# Patient Record
Sex: Female | Born: 1970 | Race: White | Hispanic: No | Smoking: Never smoker
Health system: Southern US, Community
[De-identification: ages and names within clinical notes are randomized; demographics above are authoritative.]

## PROBLEM LIST (undated history)

## (undated) DIAGNOSIS — J45909 Unspecified asthma, uncomplicated: Secondary | ICD-10-CM

## (undated) DIAGNOSIS — K219 Gastro-esophageal reflux disease without esophagitis: Secondary | ICD-10-CM

## (undated) DIAGNOSIS — F419 Anxiety disorder, unspecified: Secondary | ICD-10-CM

## (undated) DIAGNOSIS — K589 Irritable bowel syndrome without diarrhea: Secondary | ICD-10-CM

## (undated) DIAGNOSIS — Z9109 Other allergy status, other than to drugs and biological substances: Secondary | ICD-10-CM

## (undated) DIAGNOSIS — I10 Essential (primary) hypertension: Secondary | ICD-10-CM

## (undated) HISTORY — DX: Unspecified asthma, uncomplicated: J45.909

## (undated) HISTORY — PX: BREAST EXCISIONAL BIOPSY: SUR124

## (undated) HISTORY — DX: Other allergy status, other than to drugs and biological substances: Z91.09

---
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
## 2001-04-02 ENCOUNTER — Other Ambulatory Visit: Admission: RE | Admit: 2001-04-02 | Discharge: 2001-04-02 | Payer: Self-pay | Admitting: *Deleted

## 2002-08-30 ENCOUNTER — Other Ambulatory Visit: Admission: RE | Admit: 2002-08-30 | Discharge: 2002-08-30 | Payer: Self-pay | Admitting: *Deleted

## 2003-02-24 ENCOUNTER — Emergency Department (HOSPITAL_COMMUNITY): Admission: EM | Admit: 2003-02-24 | Discharge: 2003-02-24 | Payer: Self-pay | Admitting: Emergency Medicine

## 2003-10-31 ENCOUNTER — Other Ambulatory Visit: Admission: RE | Admit: 2003-10-31 | Discharge: 2003-10-31 | Payer: Self-pay | Admitting: *Deleted

## 2004-11-17 ENCOUNTER — Other Ambulatory Visit: Admission: RE | Admit: 2004-11-17 | Discharge: 2004-11-17 | Payer: Self-pay | Admitting: Obstetrics and Gynecology

## 2005-07-11 HISTORY — PX: TUBAL LIGATION: SHX77

## 2006-05-11 ENCOUNTER — Encounter: Admission: RE | Admit: 2006-05-11 | Discharge: 2006-05-11 | Payer: Self-pay | Admitting: Obstetrics and Gynecology

## 2009-01-13 ENCOUNTER — Ambulatory Visit (HOSPITAL_BASED_OUTPATIENT_CLINIC_OR_DEPARTMENT_OTHER): Admission: RE | Admit: 2009-01-13 | Discharge: 2009-01-13 | Payer: Self-pay | Admitting: Urology

## 2010-08-01 ENCOUNTER — Encounter: Payer: Self-pay | Admitting: Obstetrics and Gynecology

## 2010-10-18 LAB — POCT HEMOGLOBIN-HEMACUE: Hemoglobin: 13.2 g/dL (ref 12.0–15.0)

## 2010-11-08 ENCOUNTER — Emergency Department (HOSPITAL_COMMUNITY)
Admission: EM | Admit: 2010-11-08 | Discharge: 2010-11-08 | Disposition: A | Discharge status: Home / Self Care | Payer: 59 | Attending: Emergency Medicine | Admitting: Emergency Medicine

## 2010-11-08 ENCOUNTER — Emergency Department (HOSPITAL_COMMUNITY): Payer: 59

## 2010-11-08 DIAGNOSIS — R10812 Left upper quadrant abdominal tenderness: Secondary | ICD-10-CM | POA: Insufficient documentation

## 2010-11-08 DIAGNOSIS — K659 Peritonitis, unspecified: Secondary | ICD-10-CM | POA: Insufficient documentation

## 2010-11-08 DIAGNOSIS — R109 Unspecified abdominal pain: Secondary | ICD-10-CM | POA: Insufficient documentation

## 2010-11-08 DIAGNOSIS — Z87442 Personal history of urinary calculi: Secondary | ICD-10-CM | POA: Insufficient documentation

## 2010-11-08 LAB — DIFFERENTIAL
Basophils Absolute: 0 10*3/uL (ref 0.0–0.1)
Basophils Relative: 0 % (ref 0–1)
Monocytes Absolute: 0.5 10*3/uL (ref 0.1–1.0)
Neutro Abs: 5.8 10*3/uL (ref 1.7–7.7)
Neutrophils Relative %: 66 % (ref 43–77)

## 2010-11-08 LAB — URINALYSIS, ROUTINE W REFLEX MICROSCOPIC
Bilirubin Urine: NEGATIVE
Nitrite: NEGATIVE
Specific Gravity, Urine: 1.012 (ref 1.005–1.030)
Urobilinogen, UA: 0.2 mg/dL (ref 0.0–1.0)

## 2010-11-08 LAB — COMPREHENSIVE METABOLIC PANEL
ALT: 33 U/L (ref 0–35)
AST: 22 U/L (ref 0–37)
CO2: 26 mEq/L (ref 19–32)
Calcium: 9.1 mg/dL (ref 8.4–10.5)
GFR calc Af Amer: >60 mL/min (ref >60)
Sodium: 137 mEq/L (ref 135–145)
Total Protein: 7.3 g/dL (ref 6.0–8.3)

## 2010-11-08 LAB — POCT PREGNANCY, URINE: Preg Test, Ur: NEGATIVE

## 2010-11-08 LAB — CBC
Hemoglobin: 13.4 g/dL (ref 12.0–15.0)
MCHC: 34.4 g/dL (ref 30.0–36.0)

## 2010-11-08 MED ORDER — IOHEXOL 300 MG/ML  SOLN
100.0000 mL | Freq: Once | INTRAMUSCULAR | Status: AC | PRN
  Administered 2010-11-08: 100 mL via INTRAVENOUS

## 2010-11-23 NOTE — Op Note (Signed)
[name], Linskey Olson                 ACCOUNT NO.:  [id number]   MEDICAL RECORD NO.:  [id number]          PATIENT TYPE:  AMB   LOCATION:  [facility]                         FACILITY:  [facility]   PHYSICIAN:  Maretta Bees. Vonita Moss, M.D.DATE OF BIRTH:  April 19, 1971   DATE OF PROCEDURE:  DATE OF DISCHARGE:                               OPERATIVE REPORT   PREOPERATIVE DIAGNOSIS:  Right distal ureteral stone.   POSTOPERATIVE DIAGNOSIS:  Right distal ureteral stone.   PROCEDURES PERFORMED:  1. Cystourethroscopy.  2. Right retrograde pyelography with intraoperative fluoroscopy and      interpretation.  3. Right ureteroscopic stone manipulation with Holmium laser      lithotripsy and basket extraction of stone fragments.  4. Insertion of right 6 x 28 double-J ureteral stent.   SURGEON:  Maretta Bees. Vonita Moss, M.D.   RESIDENTFreda Jackson.   ANESTHESIA:  General endotracheal anesthesia.   COMPLICATIONS:  None.   SPECIMENS:  Stones sent for compositional analysis.   FINDINGS:  The patient had a large 9- to 10-mm impacted right distal  ureteral stone.  The distal right ureter surrounding the stone had  evidence of inflammation and edema.   INDICATIONS FOR PROCEDURE:  Tracey Olson is a 40 year old Caucasian  female who presented with some vague low back pain and right flank pain  for several weeks to months.  She was evaluated with a CT scan which  revealed a 9-mm distal right ureteral stone with associated moderate  right hydroureteronephrosis.  She was counseled on all treatment options  and elected to undergo ureteroscopic stone manipulation.   DESCRIPTION OF PROCEDURE IN DETAIL:  Tracey Olson was brought back to the  operating room, correctly identified via her wristband.  A preop time  out was called to confirm correct patient, procedure, and site.  After  successful induction of general endotracheal anesthesia, she was  positioned in the dorsal lithotomy position and great efforts were made  to  reduce any pressure on bony prominences.  IV antibiotics were  administered, her perineum was prepped and draped in usual sterile  fashion.  Surgeons were sterilely gowned and gloved.   A 26-French cystoscopic sheath was used to insert a 12-degree lens  transurethrally into the bladder.  Pan cystoscopy was performed.  This  revealed a bladder that was normal in shape and capacity.  There was no  evidence of any abnormal lesions, diverticula, stone or foreign bodies.  Bilateral ureteral orifices were noted in their usual anatomic position  and seemed to be effluxing clear yellow urine.   Attention was then turned to the right ureteral orifice.  Right ureteral  orifice was gently cannulated with a 5-French end-hole catheter.  Right  retrograde pyelogram was performed.  This revealed a filling defect in  the right distal ureter corresponding with the patient's known history  of right distal ureteral calculus.  There were no other associated  filling defects identified.  There was some proximal  hydroureteronephrosis identified.  There were no fixed filling defects  within the ureter or collecting system.   A Sensor wire was then advanced  via end-hole catheter past the stone  with some difficulty as the stone was impacted.  However, we were able  to get past the stone without too much effort.  The Sensor wire was then  held in place and used as a safety wire for the duration of the  procedure.   A 7.5-French semi-rigid ureteroscope was then inserted transurethrally  and right ureteroscopy was performed.  The stone was identified in the  distal ureter.  Using the Holmium laser at settings of 0.6 and 6 the  stone was fragmented into several small pieces.  A Nitinol basket was  then used to grasp the fragments and they were placed within the  bladder.  Proximal to the stone ureteroscopy was also performed up to  the level of the ureteropelvic junction and there were no additional   abnormalities or fragments identified.  Once all the stone fragments  except for some fine gravel were removed, a 6 x 28 double-J ureteral  stent was inserted via the Seldinger technique.  Excellent proximal and  distal curls were noted.  The stones were then irrigated from the  bladder and given to the patient for subsequent analysis.  The bladder  was emptied and the procedure was terminated.   Please note, Dr. Larey Dresser was present, available, and participated  in all aspects of this patient's care.   DISPOSITION:  The patient tolerated the procedure well, was extubated  and transported to the PACU in stable condition.     ______________________________  [name] Vonita Moss, M.D.  Electronically Signed    Hadley Pen  D:  01/13/2009  T:  01/13/2009  Job:  [id number]

## 2011-04-07 ENCOUNTER — Emergency Department (HOSPITAL_COMMUNITY)
Admission: EM | Admit: 2011-04-07 | Discharge: 2011-04-07 | Disposition: A | Discharge status: Home / Self Care | Payer: 59 | Attending: Emergency Medicine | Admitting: Emergency Medicine

## 2011-04-07 ENCOUNTER — Emergency Department (HOSPITAL_COMMUNITY): Payer: 59

## 2011-04-07 DIAGNOSIS — Z87442 Personal history of urinary calculi: Secondary | ICD-10-CM | POA: Insufficient documentation

## 2011-04-07 DIAGNOSIS — R109 Unspecified abdominal pain: Secondary | ICD-10-CM | POA: Insufficient documentation

## 2011-04-07 DIAGNOSIS — K7689 Other specified diseases of liver: Secondary | ICD-10-CM | POA: Insufficient documentation

## 2011-04-07 DIAGNOSIS — M545 Low back pain, unspecified: Secondary | ICD-10-CM | POA: Insufficient documentation

## 2011-04-07 DIAGNOSIS — Z8719 Personal history of other diseases of the digestive system: Secondary | ICD-10-CM | POA: Insufficient documentation

## 2011-04-07 LAB — POCT I-STAT, CHEM 8
BUN: 8 mg/dL (ref 6–23)
Creatinine, Ser: 0.9 mg/dL (ref 0.50–1.10)
Potassium: 3.9 mEq/L (ref 3.5–5.1)
Sodium: 139 mEq/L (ref 135–145)

## 2011-04-07 LAB — URINALYSIS, ROUTINE W REFLEX MICROSCOPIC
Glucose, UA: NEGATIVE mg/dL
Hgb urine dipstick: NEGATIVE
Specific Gravity, Urine: 1.004 — ABNORMAL LOW (ref 1.005–1.030)
pH: 7 (ref 5.0–8.0)

## 2012-01-16 ENCOUNTER — Ambulatory Visit (INDEPENDENT_AMBULATORY_CARE_PROVIDER_SITE_OTHER): Payer: 59 | Admitting: Family Medicine

## 2012-01-16 VITALS — BP 150/86 | HR 60 | Temp 99.2°F | Resp 16 | Ht 65.75 in | Wt 167.4 lb

## 2012-01-16 DIAGNOSIS — L0291 Cutaneous abscess, unspecified: Secondary | ICD-10-CM

## 2012-01-16 DIAGNOSIS — L0390 Cellulitis, unspecified: Secondary | ICD-10-CM

## 2012-01-16 MED ORDER — SULFAMETHOXAZOLE-TRIMETHOPRIM 800-160 MG PO TABS: ORAL_TABLET | ORAL | Status: DC

## 2012-01-16 NOTE — Progress Notes (Signed)
41 yo woman who works for [employer] and has two days of progressive abdominal rash with malaise, sweats and bloating. Her son has had cellulitis.  O:  12 cm area of erythema with minimal induration, no fluctuance in mid abdomen.  A: rapidly progressive cellulitis in patient with penicillin allergy  P:  Septra DS ii bid x 10 days rtc if not improving

## 2012-01-16 NOTE — Patient Instructions (Addendum)

## 2012-03-12 ENCOUNTER — Ambulatory Visit (INDEPENDENT_AMBULATORY_CARE_PROVIDER_SITE_OTHER): Payer: 59 | Admitting: Emergency Medicine

## 2012-03-12 VITALS — BP 142/86 | HR 68 | Temp 98.4°F | Resp 14 | Ht 66.0 in | Wt 167.4 lb

## 2012-03-12 DIAGNOSIS — W57XXXA Bitten or stung by nonvenomous insect and other nonvenomous arthropods, initial encounter: Secondary | ICD-10-CM

## 2012-03-12 DIAGNOSIS — L0291 Cutaneous abscess, unspecified: Secondary | ICD-10-CM

## 2012-03-12 DIAGNOSIS — L0390 Cellulitis, unspecified: Secondary | ICD-10-CM

## 2012-03-12 MED ORDER — PREDNISONE 10 MG PO TABS: ORAL_TABLET | ORAL | Status: DC

## 2012-03-12 MED ORDER — DOXYCYCLINE HYCLATE 100 MG PO TABS: 100.0000 mg | ORAL_TABLET | Freq: Two times a day (BID) | ORAL | Status: AC

## 2012-03-12 NOTE — Patient Instructions (Signed)
Cellulitis  Cellulitis is an infection of the skin and the tissue beneath it. The area is typically red and tender. It is caused by germs (bacteria) (usually staph or strep) that enter the body through cuts or sores. Cellulitis most commonly occurs in the arms or lower legs.   HOME CARE INSTRUCTIONS    If you are given a prescription for medications which kill germs (antibiotics), take as directed until finished.   If the infection is on the arm or leg, keep the limb elevated as able.   Use a warm cloth several times per day to relieve pain and encourage healing.   See your caregiver for recheck of the infected site as directed if problems arise.   Only take over-the-counter or prescription medicines for pain, discomfort, or fever as directed by your caregiver.  SEEK MEDICAL CARE IF:    The area of redness (inflammation) is spreading, there are red streaks coming from the infected site, or if a part of the infection begins to turn dark in color.   The joint or bone underneath the infected skin becomes painful after the skin has healed.   The infection returns in the same or another area after it seems to have gone away.   A boil or bump swells up. This may be an abscess.   New, unexplained problems such as pain or fever develop.  SEEK IMMEDIATE MEDICAL CARE IF:    You have a fever.   You or your child feels drowsy or lethargic.   There is vomiting, diarrhea, or lasting discomfort or feeling ill (malaise) with muscle aches and pains.  MAKE SURE YOU:    Understand these instructions.   Will watch your condition.   Will get help right away if you are not doing well or get worse.  Document Released: 04/06/2005 Document Revised: 06/16/2011 Document Reviewed: 02/13/2008  ExitCare Patient Information 2012 [employer].  Insect Bite  Mosquitoes, flies, fleas, bedbugs, and many other insects can bite. Insect bites are different from insect stings. A sting is when venom is injected into the skin. Some  insect bites can transmit infectious diseases.  SYMPTOMS   Insect bites usually turn red, swell, and itch for 2 to 4 days. They often go away on their own.  TREATMENT   Your caregiver may prescribe antibiotic medicines if a bacterial infection develops in the bite.  HOME CARE INSTRUCTIONS   Do not scratch the bite area.   Keep the bite area clean and dry. Wash the bite area thoroughly with soap and water.   Put ice or cool compresses on the bite area.   Put ice in a plastic bag.   Place a towel between your skin and the bag.   Leave the ice on for 20 minutes, 4 times a day for the first 2 to 3 days, or as directed.   You may apply a baking soda paste, cortisone cream, or calamine lotion to the bite area as directed by your caregiver. This can help reduce itching and swelling.   Only take over-the-counter or prescription medicines as directed by your caregiver.   If you are given antibiotics, take them as directed. Finish them even if you start to feel better.  You may need a tetanus shot if:   You cannot remember when you had your last tetanus shot.   You have never had a tetanus shot.   The injury broke your skin.  If you get a tetanus shot, your arm may   swell, get red, and feel warm to the touch. This is common and not a problem. If you need a tetanus shot and you choose not to have one, there is a rare chance of getting tetanus. Sickness from tetanus can be serious.  SEEK IMMEDIATE MEDICAL CARE IF:    You have increased pain, redness, or swelling in the bite area.   You see a red line on the skin coming from the bite.   You have a fever.   You have joint pain.   You have a headache or neck pain.   You have unusual weakness.   You have a rash.   You have chest pain or shortness of breath.   You have abdominal pain, nausea, or vomiting.   You feel unusually tired or sleepy.  MAKE SURE YOU:    Understand these instructions.   Will watch your condition.   Will get help right away if you are  not doing well or get worse.  Document Released: 08/04/2004 Document Revised: 06/16/2011 Document Reviewed: 01/26/2011  Exit[employer] Patient Information 2012 [employer].

## 2012-03-12 NOTE — Progress Notes (Signed)
  Subjective:    Patient ID: Tracey Olson, female    DOB: May 04, 1971, 41 y.o.   MRN: [id number]  HPI Patient presents today with an allergic reaction to insect bites. She was outside a couple days ago and the bites have started showing up. She states she feels like these episodes occur when she goes outside. She denies any other recent travel.    Review of Systems     Objective:   Physical Exam the bites on the right cheek both forearms and over the backs of her legs.        Assessment & Plan:  Patient here with multiple to be insect bites on her face extremities and lower legs. Some of the areas appear to have a purulent drainage and culture was done of these areas .

## 2012-03-12 NOTE — Progress Notes (Deleted)
  Subjective:    Patient ID: Tracey Olson, female    DOB: [birth year], 41 y.o.   MRN: [id number]  HPI     Review of Systems     Objective:   Physical Exam        Assessment & Plan:

## 2012-03-15 LAB — WOUND CULTURE
Gram Stain: NONE SEEN
Gram Stain: NONE SEEN

## 2012-03-19 ENCOUNTER — Telehealth: Payer: Self-pay

## 2012-03-19 NOTE — Telephone Encounter (Signed)
Pt has numbness in her hands a week after starting the medication prescribed a week earlier. She is tired and fatigued also.   Wants to know what to do.  [phone]

## 2012-03-19 NOTE — Telephone Encounter (Signed)
Hands numb, I have advised this is not usually associated with allergy to Doxycycline. She is advised to return to clinic for her hand numbness. She wants to know if she can discontinue her doxy at this point, advised best to continue entire course of treatment.

## 2013-08-11 HISTORY — PX: COLON SURGERY: SHX602

## 2013-09-26 ENCOUNTER — Ambulatory Visit: Payer: 59 | Admitting: Emergency Medicine

## 2013-09-26 VITALS — BP 130/80 | HR 81 | Temp 98.7°F | Resp 16 | Ht 66.0 in | Wt 178.0 lb

## 2013-09-26 DIAGNOSIS — J45901 Unspecified asthma with (acute) exacerbation: Secondary | ICD-10-CM

## 2013-09-26 DIAGNOSIS — Z789 Other specified health status: Secondary | ICD-10-CM

## 2013-09-26 DIAGNOSIS — Z9189 Other specified personal risk factors, not elsewhere classified: Secondary | ICD-10-CM

## 2013-09-26 DIAGNOSIS — Z889 Allergy status to unspecified drugs, medicaments and biological substances status: Secondary | ICD-10-CM

## 2013-09-26 DIAGNOSIS — J45909 Unspecified asthma, uncomplicated: Secondary | ICD-10-CM

## 2013-09-26 MED ORDER — OLOPATADINE HCL 0.1 % OP SOLN: 1.0000 [drp] | Freq: Two times a day (BID) | OPHTHALMIC | Status: DC

## 2013-09-26 MED ORDER — PREDNISONE 20 MG PO TABS: ORAL_TABLET | ORAL | Status: DC

## 2013-09-26 MED ORDER — ALBUTEROL SULFATE (2.5 MG/3ML) 0.083% IN NEBU
2.5000 mg | INHALATION_SOLUTION | Freq: Once | RESPIRATORY_TRACT | Status: AC
  Administered 2013-09-26: 2.5 mg via RESPIRATORY_TRACT

## 2013-09-26 MED ORDER — BUDESONIDE-FORMOTEROL FUMARATE 160-4.5 MCG/ACT IN AERO: 2.0000 | INHALATION_SPRAY | Freq: Two times a day (BID) | RESPIRATORY_TRACT | Status: DC

## 2013-09-26 MED ORDER — ALBUTEROL SULFATE HFA 108 (90 BASE) MCG/ACT IN AERS: 2.0000 | INHALATION_SPRAY | Freq: Four times a day (QID) | RESPIRATORY_TRACT | Status: AC | PRN

## 2013-09-26 NOTE — Patient Instructions (Signed)
Asthma, Adult Asthma is a recurring condition in which the airways tighten and narrow. Asthma can make it difficult to breathe. It can cause coughing, wheezing, and shortness of breath. Asthma episodes (also called asthma attacks) range from minor to life-threatening. Asthma cannot be cured, but medicines and lifestyle changes can help control it. CAUSES Asthma is believed to be caused by inherited (genetic) and environmental factors, but its exact cause is unknown. Asthma may be triggered by allergens, lung infections, or irritants in the air. Asthma triggers are different for each person. Common triggers include:   Animal dander.  Dust mites.  Cockroaches.  Pollen from trees or grass.  Mold.  Smoke.  Air pollutants such as dust, household cleaners, hair sprays, aerosol sprays, paint fumes, strong chemicals, or strong odors.  Cold air, weather changes, and winds (which increase molds and pollens in the air).  Strong emotional expressions such as crying or laughing hard.  Stress.  Certain medicines (such as aspirin) or types of drugs (such as beta-blockers).  Sulfites in foods and drinks. Foods and drinks that may contain sulfites include dried fruit, potato chips, and sparkling grape juice.  Infections or inflammatory conditions such as the flu, a cold, or an inflammation of the nasal membranes (rhinitis).  Gastroesophageal reflux disease (GERD).  Exercise or strenuous activity. SYMPTOMS Symptoms may occur immediately after asthma is triggered or many hours later. Symptoms include:  Wheezing.  Excessive nighttime or early morning coughing.  Frequent or severe coughing with a common cold.  Chest tightness.  Shortness of breath. DIAGNOSIS  The diagnosis of asthma is made by a review of your medical history and a physical exam. Tests may also be performed. These may include:  Lung function studies. These tests show how much air you breath in and out.  Allergy  tests.  Imaging tests such as X-rays. TREATMENT  Asthma cannot be cured, but it can usually be controlled. Treatment involves identifying and avoiding your asthma triggers. It also involves medicines. There are 2 classes of medicine used for asthma treatment:   Controller medicines. These prevent asthma symptoms from occurring. They are usually taken every day.  Reliever or rescue medicines. These quickly relieve asthma symptoms. They are used as needed and provide short-term relief. Your health care provider will help you create an asthma action plan. An asthma action plan is a written plan for managing and treating your asthma attacks. It includes a list of your asthma triggers and how they may be avoided. It also includes information on when medicines should be taken and when their dosage should be changed. An action plan may also involve the use of a device called a peak flow meter. A peak flow meter measures how well the lungs are working. It helps you monitor your condition. HOME CARE INSTRUCTIONS   Take medicine as directed by your health care provider. Speak with your health care provider if you have questions about how or when to take the medicines.  Use a peak flow meter as directed by your health care provider. Record and keep track of readings.  Understand and use the action plan to help minimize or stop an asthma attack without needing to seek medical care.  Control your home environment in the following ways to help prevent asthma attacks:  Do not smoke. Avoid being exposed to secondhand smoke.  Change your heating and air conditioning filter regularly.  Limit your use of fireplaces and wood stoves.  Get rid of pests (such as roaches and   mice) and their droppings.  Throw away plants if you see mold on them.  Clean your floors and dust regularly. Use unscented cleaning products.  Try to have someone else vacuum for you regularly. Stay out of rooms while they are being  vacuumed and for a short while afterward. If you vacuum, use a dust mask from a hardware store, a double-layered or microfilter vacuum cleaner bag, or a vacuum cleaner with a HEPA filter.  Replace carpet with wood, tile, or vinyl flooring. Carpet can trap dander and dust.  Use allergy-proof pillows, mattress covers, and box spring covers.  Wash bed sheets and blankets every week in hot water and dry them in a dryer.  Use blankets that are made of polyester or cotton.  Clean bathrooms and kitchens with bleach. If possible, have someone repaint the walls in these rooms with mold-resistant paint. Keep out of the rooms that are being cleaned and painted.  Wash hands frequently. SEEK MEDICAL CARE IF:   You have wheezing, shortness of breath, or a cough even if taking medicine to prevent attacks.  The colored mucus you cough up (sputum) is thicker than usual.  Your sputum changes from clear or white to yellow, green, gray, or bloody.  You have any problems that may be related to the medicines you are taking (such as a rash, itching, swelling, or trouble breathing).  You are using a reliever medicine more than 2 3 times per week.  Your peak flow is still at 50 79% of you personal best after following your action plan for 1 hour. SEEK IMMEDIATE MEDICAL CARE IF:   You seem to be getting worse and are unresponsive to treatment during an asthma attack.  You are short of breath even at rest.  You get short of breath when doing very little physical activity.  You have difficulty eating, drinking, or talking due to asthma symptoms.  You develop chest pain.  You develop a fast heartbeat.  You have a bluish color to your lips or fingernails.  You are lightheaded, dizzy, or faint.  Your peak flow is less than 50% of your personal best.  You have a fever or persistent symptoms for more than 2 3 days.  You have a fever and symptoms suddenly get worse. MAKE SURE YOU:   Understand these  instructions.  Will watch your condition.  Will get help right away if you are not doing well or get worse. Document Released: 06/27/2005 Document Revised: 02/27/2013 Document Reviewed: 01/24/2013 [employer] Patient Information 2014 [employer].  

## 2013-09-26 NOTE — Progress Notes (Signed)
   Subjective:    Patient ID: Tracey BreachAnn Olson, female    DOB: 07/23/1970, 43 y.o.   MRN: [id number]  HPI 43 year old female presents with asthma. She has been having wheezing at night for the past 3 weeks. During the spring time she usually has trouble with her asthma. No new pets or change in environment. She has 2 dogs and a cat; pt has pet allergies. She got a new albuterol inhaler to help with the wheezing. Pt does not smoke; her son smokes, but he does not smoke in the house. Never has been admitted for asthma. Pt also complains of redness in her right eye. She would like a referral to see a pulmonologist. Her peak flow was 325, should be 370.   Review of Systems     Objective:   Physical Exam patient is alert and cooperative she does not appear in any distress. Her face is flushed. Her neck is supple. Chest exam reveals end-expiratory wheezes without dullness. Her cardiac exam is unremarkable with a regular rate and rhythm without murmurs. Extremities without edema.        Assessment & Plan:  We'll treat with a prednisone taper. Referral made to pulmonary. She'll also be placed on Symbicort 2 puffs twice a day. She can use her albuterol every 4-6 hours as needed. She was also given Patanol drops for her right eye discomfort.

## 2013-09-30 ENCOUNTER — Ambulatory Visit (INDEPENDENT_AMBULATORY_CARE_PROVIDER_SITE_OTHER): Payer: 59 | Admitting: Pulmonary Disease

## 2013-09-30 ENCOUNTER — Encounter: Payer: Self-pay | Admitting: Pulmonary Disease

## 2013-09-30 VITALS — BP 130/88 | HR 73 | Temp 98.1°F | Ht 66.0 in | Wt 179.0 lb

## 2013-09-30 DIAGNOSIS — R05 Cough: Secondary | ICD-10-CM

## 2013-09-30 DIAGNOSIS — J45901 Unspecified asthma with (acute) exacerbation: Secondary | ICD-10-CM

## 2013-09-30 DIAGNOSIS — R059 Cough, unspecified: Secondary | ICD-10-CM

## 2013-09-30 DIAGNOSIS — R053 Chronic cough: Secondary | ICD-10-CM | POA: Insufficient documentation

## 2013-09-30 NOTE — Patient Instructions (Signed)
nexium one am AND pm everyday for next 2 weeks. Finish up prednisone taper. Continue nasal spray and zyrtec during the day.  Get chlorpheniramine 4mg  OTC, and take 1-2 before bedtime for the next 2 weeks No throat clearing!.  Use hard candy to bathe the back of your throat during the day (no mint or menthol) No yelling, singing, or excessive voice use.   Will check chest xray today, and will call you with results. If you are still having issues, can check bloodwork once you are off prednisone for awhile to specific molds.  followup with me in 2 weeks.

## 2013-09-30 NOTE — Progress Notes (Signed)
   Subjective:    Patient ID: Tracey BreachAnn Clemence, female    DOB: 12/24/1970, 43 y.o.   MRN: [id number]  HPI The patient is a 43 year old female who I've been asked to see for management of asthma. The patient states that she was first diagnosed at age 89, and for many years was on therapy with medications such as Advair. After a while, she discontinued this medication, and has not been on any maintenance medication for years. She believes that her asthma was well-controlled until approximately 4 weeks ago, when she began to develop increasing cough, audible wheezing, and worsening dyspnea on exertion. The patient has had constant throat clearing since that time, and she feels that her "throat is filling up with fluid". She has significant postnasal drip with the onset of allergy season, despite being on nasal steroids and Zyrtec. She also describes audible wheezing at night whenever she lies down, and this is new for her. She has been treated with a course of prednisone starting last week, and she is finally starting to feel better over the last 48 hours. The patient has a history of reflux disease, but does not feel that it is an issue currently. She also has a history of positive allergy testing, and was recommended to have allergy vaccine. She decided against this many years ago. The patient also has a history of recurrent sinusitis, but currently has no symptoms. She is concerned about the finding of mold in her apartment, and whether this may be contributing to her symptoms. The patient has had no recent chest x-ray or spirometry. She also is concerned about worsening dyspnea on exertion, primarily with moderate activity.   Review of Systems  Constitutional: Negative for fever and unexpected weight change.  HENT: Positive for postnasal drip and sneezing. Negative for congestion, dental problem, ear pain, nosebleeds, rhinorrhea, sinus pressure, sore throat and trouble swallowing.        Throat clearing x 4  weeks  Eyes: Negative for redness and itching.  Respiratory: Positive for shortness of breath. Negative for cough, chest tightness and wheezing.   Cardiovascular: Negative for palpitations and leg swelling.  Gastrointestinal: Negative for nausea and vomiting.       Acid heartburn  Genitourinary: Negative for dysuria.  Musculoskeletal: Negative for joint swelling.  Skin: Negative for rash.  Neurological: Negative for headaches.  Hematological: Does not bruise/bleed easily.  Psychiatric/Behavioral: Negative for dysphoric mood. The patient is not nervous/anxious.        Objective:   Physical Exam Constitutional:  Well developed, no acute distress  HENT:  Nares patent without discharge  Oropharynx without exudate, palate and uvula are normal  Eyes:  Perrla, eomi, no scleral icterus  Neck:  No JVD, no TMG  Cardiovascular:  Normal rate, regular rhythm, no rubs or gallops.  No murmurs        Intact distal pulses  Pulmonary :  Normal breath sounds, no stridor or respiratory distress   No rales, rhonchi, or wheezing  Abdominal:  Soft, nondistended, bowel sounds present.  No tenderness noted.   Musculoskeletal:  No lower extremity edema noted.  Lymph Nodes:  No cervical lymphadenopathy noted  Skin:  No cyanosis noted  Neurologic:  Alert, appropriate, moves all 4 extremities without obvious deficit.         Assessment & Plan:

## 2013-09-30 NOTE — Addendum Note (Signed)
Addended by: Maisie FusGREEN, ASHTYN M on: 09/30/2013 04:07 PM   Modules accepted: Orders

## 2013-09-30 NOTE — Assessment & Plan Note (Signed)
The patient has significant cough, throat clearing, and classic upper airway pseudo wheezing, all of which I suspect is secondary to upper airway dysfunction. Her spirometry today is completely normal in the face of ongoing symptoms, but she is on a prednisone taper currently. She has a history of reflux, but denies symptoms currently. I have explained to her the difference between gastroesophageal reflux disease and laryngopharyngeal pharyngeal reflux disease. She also has a history of allergies and is having postnasal drip. She is describing a classic globus sensation with a feeling of upper airway closure. There may be a component of vocal cord dysfunction as well. I have stressed to her that we need to minimize irritation to her upper airway, and we'll therefore treat her more aggressively for reflux, postnasal drip, and also due behavioral therapy to minimize throat clearing. It is unknown to me whether she even has asthma, but I believe that her current symptoms are not secondary to asthma.

## 2013-10-16 ENCOUNTER — Telehealth: Payer: Self-pay | Admitting: Pulmonary Disease

## 2013-10-16 ENCOUNTER — Encounter: Payer: Self-pay | Admitting: Pulmonary Disease

## 2013-10-16 ENCOUNTER — Other Ambulatory Visit: Payer: Self-pay | Admitting: Pulmonary Disease

## 2013-10-16 DIAGNOSIS — R05 Cough: Secondary | ICD-10-CM

## 2013-10-16 DIAGNOSIS — R053 Chronic cough: Secondary | ICD-10-CM

## 2013-10-16 NOTE — Telephone Encounter (Signed)
lmomtcb for pt 

## 2013-10-16 NOTE — Telephone Encounter (Signed)
Per OV 09/30/13: Patient Instructions      nexium one am AND pm everyday for next 2 weeks. Finish up prednisone taper. Continue nasal spray and zyrtec during the day.  Get chlorpheniramine 4mg  OTC, and take 1-2 before bedtime for the next 2 weeks No throat clearing!.  Use hard candy to bathe the back of your throat during the day (no mint or menthol) No yelling, singing, or excessive voice use.    Will check chest xray today, and will call you with results. If you are still having issues, can check bloodwork once you are off prednisone for awhile to specific molds.   followup with me in 2 weeks.   --  Pt reports she cancelled her appt bc she reports KC advised her he did not think this was her asthma. She reports she has been taking nexium. She is still having the urge to clear her throat (but is trying not too), raspy voice, problems with throat (pain certain times), feeling of cough all the time, loss of voice at times. She is having problems when she eats at times she is choking. No chest discomfort now. She did not have CXR bc it was down last time and also she would have to pay out of pocket for this d/t high deductible. She is still doing everything KC recommended and still the same. She wants to know if she needs to go to ENT or GI? Please advise KC thanks

## 2013-10-16 NOTE — Telephone Encounter (Signed)
Make sure she understands that I didn't say she didn't have asthma, but that I didn't think her current symptoms were from asthma.  She needs to stay on acid reflux med twice a day, and stay on the behavioral therapies that I have recommended (no throat clearing, hard candy, etc) Will refer to ENT to examine her voice box and back of throat.  If they do not find anything, we can then refer her to GI.  She still needs to have a cxr at some point to make sure we are not missing anything in her chest that can't be heard on exam.

## 2013-10-17 NOTE — Telephone Encounter (Signed)
Spoke with pt and notified of recs per [facility]  She verbalized understanding and order was [facility] to [facility]  She will come in soon for cxr to be done  Nothing further needed per pt

## 2013-10-18 ENCOUNTER — Ambulatory Visit: Payer: 59 | Admitting: Pulmonary Disease

## 2014-01-03 ENCOUNTER — Encounter: Payer: Self-pay | Admitting: Pulmonary Disease

## 2014-12-31 ENCOUNTER — Ambulatory Visit (INDEPENDENT_AMBULATORY_CARE_PROVIDER_SITE_OTHER): Payer: 59 | Admitting: Physician Assistant

## 2014-12-31 VITALS — BP 134/84 | HR 95 | Temp 99.8°F | Resp 16 | Ht 65.5 in | Wt 179.0 lb

## 2014-12-31 DIAGNOSIS — S30860A Insect bite (nonvenomous) of lower back and pelvis, initial encounter: Secondary | ICD-10-CM

## 2014-12-31 DIAGNOSIS — T7840XA Allergy, unspecified, initial encounter: Secondary | ICD-10-CM

## 2014-12-31 DIAGNOSIS — W57XXXA Bitten or stung by nonvenomous insect and other nonvenomous arthropods, initial encounter: Secondary | ICD-10-CM | POA: Diagnosis not present

## 2014-12-31 DIAGNOSIS — L03317 Cellulitis of buttock: Secondary | ICD-10-CM

## 2014-12-31 LAB — POCT CBC
Granulocyte percent: 76.4 %G (ref 37–80)
HEMATOCRIT: 41.1 % (ref 37.7–47.9)
HEMOGLOBIN: 13.7 g/dL (ref 12.2–16.2)
Lymph, poc: 2.7 (ref 0.6–3.4)
MCH: 30.9 pg (ref 27–31.2)
MCHC: 33.3 g/dL (ref 31.8–35.4)
MCV: 92.9 fL (ref 80–97)
MID (CBC): 0.9 (ref 0–0.9)
MPV: 6.7 fL (ref 0–99.8)
POC Granulocyte: 11.7 — AB (ref 2–6.9)
POC LYMPH PERCENT: 17.6 %L (ref 10–50)
POC MID %: 6 % (ref 0–12)
Platelet Count, POC: 311 10*3/uL (ref 142–424)
RBC: 4.43 M/uL (ref 4.04–5.48)
RDW, POC: 12.7 %
WBC: 15.3 10*3/uL — AB (ref 4.6–10.2)

## 2014-12-31 MED ORDER — DOXYCYCLINE HYCLATE 100 MG PO CAPS: 100.0000 mg | ORAL_CAPSULE | Freq: Two times a day (BID) | ORAL | Status: AC

## 2014-12-31 MED ORDER — METHYLPREDNISOLONE ACETATE 80 MG/ML IJ SUSP
80.0000 mg | Freq: Once | INTRAMUSCULAR | Status: AC
  Administered 2014-12-31: 80 mg via INTRAMUSCULAR

## 2014-12-31 NOTE — Patient Instructions (Signed)
Take doxycycline twice a day for 10 days. Apply warm compresses. Apply deet when going outside! Drink plenty of fluids. Continue home cortisone cream and chlorpheniramine. Return on Friday for recheck.

## 2014-12-31 NOTE — Progress Notes (Signed)
[facility] [address], [place] [address] [phone]  Date:  12/31/2014   Name:  Tracey Olson   DOB:  22-Mar-1971   MRN:  [id number]  PCP:  No PCP Per Patient    Chief Complaint: Allergic Reaction   History of Present Illness:  This is a 44 y.o. female with PMH GERD who is presenting with allergic reaction to mosquito bites. States she is very allergic to mosquito bites and when she does get bit, she is prone to developing cellulitis. She has been told she needs to wear 100% deet during the summer months when going outside. She woke during the night 17 hours ago with bites on her left buttock and on her right posterior thigh inferior to right buttock. They are very itchy and painful and have been getting progressively worse. She notes she got a few more bites on her bilateral lower legs 1.5 hours ago. She is trying cortisone cream and chlorpheniramine and helping some. In the past she has gotten doxy for these symptoms and gets better quickly. She has a temperature of 99.8 in office today but states she does not feel sick. She denies n/v/d, SOB, CP, oral swelling, dysphagia.  Review of Systems:  Review of Systems See HPI  Patient Active Problem List   Diagnosis Date Noted  . Chronic cough 09/30/2013    Prior to Admission medications   Medication Sig Start Date End Date Taking? Authorizing Provider  albuterol (PROVENTIL HFA;VENTOLIN HFA) 108 (90 BASE) MCG/ACT inhaler Inhale 2 puffs into the lungs every 6 (six) hours as needed. 09/26/13  Yes Collene Gobble, MD  chlorpheniramine (CHLOR-TRIMETON) 4 MG tablet Take 4 mg by mouth 2 (two) times daily as needed for allergies.   Yes Historical Provider, MD  cholecalciferol (VITAMIN D) 1000 UNITS tablet Take 2,000 Units by mouth daily.   Yes Historical Provider, MD  omeprazole (PRILOSEC) 20 MG capsule Take 20 mg by mouth daily.   Yes Historical Provider, MD                         Allergies  Allergen Reactions  .  Penicillins Cross Reactors Other (See Comments)    Swelling around mouth, lips    Past Surgical History  Procedure Laterality Date  . Colon surgery  08/2013    polyp removal  . Tubal ligation  2007  . Breast surgery Left 1990    lumpectomy    History  Substance Use Topics  . Smoking status: Never Smoker   . Smokeless tobacco: Not on file  . Alcohol Use: Yes     Comment: 1 x week    Family History  Problem Relation Age of Onset  . Heart disease Maternal Grandfather   . Heart disease Paternal Grandfather   . Cancer Maternal Grandmother     Medication list has been reviewed and updated.  Physical Examination:  Physical Exam  Constitutional: She is oriented to person, place, and time. She appears well-developed and well-nourished. No distress.  HENT:  Head: Normocephalic and atraumatic.  Right Ear: Hearing normal.  Left Ear: Hearing normal.  Nose: Nose normal.  Mouth/Throat: Uvula is midline, oropharynx is clear and moist and mucous membranes are normal. No uvula swelling.  Eyes: Conjunctivae and lids are normal. Right eye exhibits no discharge. Left eye exhibits no discharge. No scleral icterus.  Cardiovascular: Normal rate, regular rhythm, normal heart sounds and normal pulses.   No murmur heard.  Pulmonary/Chest: Effort normal and breath sounds normal. No respiratory distress. She has no wheezes. She has no rhonchi. She has no rales.  Musculoskeletal: Normal range of motion.  Neurological: She is alert and oriented to person, place, and time.  Skin: Skin is warm, dry and intact.  5 inch area of erythema on left buttock. 2 inches of central induration. No fluctuance. Tenderness present. 6 inch area of erythema on right thigh under buttock. No induration. No fluctuance. Some mild tenderness.  3-4 small 1 cm erythematous papules scattered over bilateral lower legs, no associated tenderness or swelling.   Psychiatric: She has a normal mood and affect. Her speech is normal  and behavior is normal. Thought content normal.   BP 134/84 mmHg  Pulse 95  Temp(Src) 99.8 F (37.7 C) (Oral)  Resp 16  Ht 5' 5.5" (1.664 m)  Wt 179 lb (81.194 kg)  BMI 29.32 kg/m2  SpO2 98%  Results for orders placed or performed in visit on 12/31/14  POCT CBC  Result Value Ref Range   WBC 15.3 (A) 4.6 - 10.2 K/uL   Lymph, poc 2.7 0.6 - 3.4   POC LYMPH PERCENT 17.6 10 - 50 %L   MID (cbc) 0.9 0 - 0.9   POC MID % 6.0 0 - 12 %M   POC Granulocyte 11.7 (A) 2 - 6.9   Granulocyte percent 76.4 37 - 80 %G   RBC 4.43 4.04 - 5.48 M/uL   Hemoglobin 13.7 12.2 - 16.2 g/dL   HCT, POC 40.9 81.1 - 47.9 %   MCV 92.9 80 - 97 fL   MCH, POC 30.9 27 - 31.2 pg   MCHC 33.3 31.8 - 35.4 g/dL   RDW, POC 91.4 %   Platelet Count, POC 311 142 - 424 K/uL   MPV 6.7 0 - 99.8 fL   Assessment and Plan:  1. Cellulitis of buttock 2. Allergic reaction, initial encounter 3. Insect bite Rapidly developing cellulitis of left buttock and right thigh. Wbc 15.3. Fever 99.8. Gave depomedrol injection in office. Pt stated she did not want to take oral prednisone. Prescribed doxy as has worked well in the past for similar situation. Cellulitic areas demarcated. Stressed importance of applying deet at all times when outdoors - unfortunately she was bit during the night in her house. Return in 48-72 hours for recheck. Monitor temp at home - if still present in 24-48 hours, return. - POCT CBC - doxycycline (VIBRAMYCIN) 100 MG capsule; Take 1 capsule (100 mg total) by mouth 2 (two) times daily.  Dispense: 20 capsule; Refill: 0 - methylPREDNISolone acetate (DEPO-MEDROL) injection 80 mg; Inject 1 mL (80 mg total) into the muscle once.   [place] Min[place] Dyke Brackett, MHS [facility]  12/31/2014

## 2015-01-03 ENCOUNTER — Ambulatory Visit (INDEPENDENT_AMBULATORY_CARE_PROVIDER_SITE_OTHER): Payer: 59 | Admitting: Family Medicine

## 2015-01-03 VITALS — BP 130/80 | HR 78 | Temp 98.4°F | Resp 16 | Ht 66.0 in | Wt 178.2 lb

## 2015-01-03 DIAGNOSIS — L0291 Cutaneous abscess, unspecified: Secondary | ICD-10-CM | POA: Diagnosis not present

## 2015-01-03 DIAGNOSIS — L0390 Cellulitis, unspecified: Secondary | ICD-10-CM | POA: Diagnosis not present

## 2015-01-03 MED ORDER — MUPIROCIN 2 % EX OINT: 1.0000 "application " | TOPICAL_OINTMENT | Freq: Two times a day (BID) | CUTANEOUS | Status: DC

## 2015-01-03 NOTE — Progress Notes (Signed)
44-year-old woman who works for [employer] who comes in for recheck on cellulitis. She's taking her doxycycline as directed. She's having quite a bit of itching now but no pain. She also has no fever.  Patient has had this happen to her before and is interested in prevention. She's planning moving out of her apartment soon.  Objective: No acute distress BP 130/80 mmHg  Pulse 78  Temp(Src) 98.4 F (36.9 C) (Oral)  Resp 16  Ht 5\' 6"  (1.676 m)  Wt 178 lb 3.2 oz (80.831 kg)  BMI 28.78 kg/m2  SpO2 99% Examination of posterior thighs reveals fading erythema both posterior thighs.  Assessment: Resolving cellulitis, recurrent  Plan:     ICD-9-CM ICD-10-CM   1. Cellulitis and abscess 682.9 L03.90 mupirocin ointment (BACTROBAN) 2 %    L02.91      Signed, Elvina Sidle, MD

## 2015-02-05 ENCOUNTER — Telehealth: Payer: Self-pay

## 2015-02-05 DIAGNOSIS — L0390 Cellulitis, unspecified: Secondary | ICD-10-CM

## 2015-02-05 MED ORDER — DOXYCYCLINE HYCLATE 100 MG PO CAPS: 100.0000 mg | ORAL_CAPSULE | Freq: Two times a day (BID) | ORAL | Status: AC

## 2015-02-05 NOTE — Telephone Encounter (Signed)
Sent doxy to pharmacy. Only 7 days worth this time. Make sure she is wearing 100% deet at all times when outside. If this happens again, she will need to come back.

## 2015-02-05 NOTE — Telephone Encounter (Signed)
Cellituis on arm after insect bite, she was recently treated for this and is requesting medication.   [facility] [place] [facility] [address]   [phone]

## 2015-02-05 NOTE — Telephone Encounter (Signed)
Spoke with pt, advised message from Nicole. Pt understood. 

## 2015-02-05 NOTE — Telephone Encounter (Signed)
Tracey Olson, pt wanted me to ask if you could rx her an abx. She says she has cellulitis similar to what she had last time except it is on her right forearm. She has been using Bactroban from her last OV without success. She has to pay 100% every time she comes in because of her deductible and can't afford to come in. Please advise. Thanks

## 2015-12-07 ENCOUNTER — Encounter (HOSPITAL_COMMUNITY): Payer: Self-pay | Admitting: Emergency Medicine

## 2015-12-07 ENCOUNTER — Emergency Department (HOSPITAL_COMMUNITY): Payer: 59

## 2015-12-07 ENCOUNTER — Emergency Department (HOSPITAL_COMMUNITY)
Admission: EM | Admit: 2015-12-07 | Discharge: 2015-12-08 | Disposition: A | Discharge status: Home / Self Care | Payer: 59 | Attending: Emergency Medicine | Admitting: Emergency Medicine

## 2015-12-07 DIAGNOSIS — J45909 Unspecified asthma, uncomplicated: Secondary | ICD-10-CM | POA: Insufficient documentation

## 2015-12-07 DIAGNOSIS — Z880 Allergy status to penicillin: Secondary | ICD-10-CM | POA: Diagnosis not present

## 2015-12-07 DIAGNOSIS — Z792 Long term (current) use of antibiotics: Secondary | ICD-10-CM | POA: Insufficient documentation

## 2015-12-07 DIAGNOSIS — R002 Palpitations: Secondary | ICD-10-CM | POA: Diagnosis not present

## 2015-12-07 DIAGNOSIS — K219 Gastro-esophageal reflux disease without esophagitis: Secondary | ICD-10-CM | POA: Diagnosis not present

## 2015-12-07 DIAGNOSIS — F419 Anxiety disorder, unspecified: Secondary | ICD-10-CM | POA: Diagnosis not present

## 2015-12-07 DIAGNOSIS — Z79899 Other long term (current) drug therapy: Secondary | ICD-10-CM | POA: Diagnosis not present

## 2015-12-07 DIAGNOSIS — R079 Chest pain, unspecified: Secondary | ICD-10-CM | POA: Diagnosis not present

## 2015-12-07 DIAGNOSIS — I10 Essential (primary) hypertension: Secondary | ICD-10-CM | POA: Diagnosis not present

## 2015-12-07 HISTORY — DX: Anxiety disorder, unspecified: F41.9

## 2015-12-07 HISTORY — DX: Gastro-esophageal reflux disease without esophagitis: K21.9

## 2015-12-07 HISTORY — DX: Irritable bowel syndrome, unspecified: K58.9

## 2015-12-07 HISTORY — DX: Essential (primary) hypertension: I10

## 2015-12-07 LAB — BASIC METABOLIC PANEL
ANION GAP: 7 (ref 5–15)
BUN: 16 mg/dL (ref 6–20)
CALCIUM: 9.5 mg/dL (ref 8.9–10.3)
CO2: 25 mmol/L (ref 22–32)
CREATININE: 0.89 mg/dL (ref 0.44–1.00)
Chloride: 105 mmol/L (ref 101–111)
GFR calc non Af Amer: >60 mL/min (ref >60)
Glucose, Bld: 116 mg/dL — ABNORMAL HIGH (ref 65–99)
Potassium: 3.6 mmol/L (ref 3.5–5.1)
SODIUM: 137 mmol/L (ref 135–145)

## 2015-12-07 LAB — CBC
HCT: 40.2 % (ref 36.0–46.0)
HEMOGLOBIN: 12.8 g/dL (ref 12.0–15.0)
MCH: 30.3 pg (ref 26.0–34.0)
MCHC: 31.8 g/dL (ref 30.0–36.0)
MCV: 95 fL (ref 78.0–100.0)
PLATELETS: 312 10*3/uL (ref 150–400)
RBC: 4.23 MIL/uL (ref 3.87–5.11)
RDW: 12.2 % (ref 11.5–15.5)
WBC: 10.3 10*3/uL (ref 4.0–10.5)

## 2015-12-07 LAB — I-STAT TROPONIN, ED: TROPONIN I, POC: 0 ng/mL (ref 0.00–0.08)

## 2015-12-07 NOTE — ED Provider Notes (Signed)
CSN: [id number]     Arrival date & time 12/07/15  2226 History  By signing my name below, I, Tracey Olson, attest that this documentation has been prepared under the direction and in the presence of Densil Ottey, MD. Electronically Signed: Bridgette HabermannMaria Olson, ED Scribe. 12/08/2015. 12:29 AM.   Chief Complaint  Patient presents with  . Chest Pain  . Palpitations   Patient is a 45 y.o. female presenting with palpitations. The history is provided by the patient. No language interpreter was used.  Palpitations Palpitations quality:  Unable to specify Onset quality:  Sudden Duration:  3 hours Timing:  Intermittent Progression:  Worsening Chronicity:  Recurrent Context: not caffeine   Relieved by:  Nothing Worsened by:  Nothing Ineffective treatments: TUMS, Prilosec and Xanax. Associated symptoms: chest pain   Associated symptoms: no cough, no shortness of breath and no vomiting     HPI Comments: Tracey Olson is a 45 y.o. female with h/o of IBS, GERD, and hypertension who presents to the Emergency Department complaining of intermittent, gradually worsening heart palpitations onset a couple months ago but has worsened today. Patient also has associated back pain between her shoulder blades, chest pain, nausea, and tightness in jaw. Patient states palpitations feel as if "heart is jumping out of her throat". Patient took two Xanax today to alleviate her symptoms with no relief. she also reports taking Tums and Prilosec to alleviate her symptoms due thinking it could be GERD. Per patient, she ate cereal for breakfast and had [employer] for lunch. [place]. Patient has not had a period in over a couple of years. She denies nausea, vomiting, and abdominal pain.  Past Medical History  Diagnosis Date  . Asthma     allergy testing Dr Ephriam KnucklesSharma-2008?  Marland Kitchen. Environmental allergies   . Hypertension   . IBS (irritable bowel syndrome)   . GERD (gastroesophageal reflux disease)    . Anxiety    Past Surgical History  Procedure Laterality Date  . Colon surgery  08/2013    polyp removal  . Tubal ligation  2007  . Breast surgery Left 1990    lumpectomy   Family History  Problem Relation Age of Onset  . Heart disease Maternal Grandfather   . Heart disease Paternal Grandfather   . Cancer Maternal Grandmother    Social History  Substance Use Topics  . Smoking status: Never Smoker   . Smokeless tobacco: None  . Alcohol Use: Yes   OB History    No data available     Review of Systems  Respiratory: Negative for cough and shortness of breath.   Cardiovascular: Positive for chest pain and palpitations.  Gastrointestinal: Negative for vomiting.  Genitourinary: Negative for flank pain.  Psychiatric/Behavioral: The patient is nervous/anxious.   All other systems reviewed and are negative.     Allergies  Penicillins cross reactors  Home Medications   Prior to Admission medications   Medication Sig Start Date End Date Taking? Authorizing Provider  albuterol (PROVENTIL HFA;VENTOLIN HFA) 108 (90 BASE) MCG/ACT inhaler Inhale 2 puffs into the lungs every 6 (six) hours as needed. 09/26/13   Collene GobbleSteven A Daub, MD  ALPRAZolam Prudy Feeler(XANAX) 0.25 MG tablet Take 0.25 mg by mouth at bedtime as needed.    Historical Provider, MD  chlorpheniramine (CHLOR-TRIMETON) 4 MG tablet Take 4 mg by mouth 2 (two) times daily as needed for allergies.    Historical Provider, MD  cholecalciferol (VITAMIN D) 1000 UNITS tablet Take  2,000 Units by mouth daily.    Historical Provider, MD  mupirocin ointment (BACTROBAN) 2 % Place 1 application into the nose 2 (two) times daily. 01/03/15   Elvina Sidle, MD  omeprazole (PRILOSEC) 20 MG capsule Take 20 mg by mouth daily.    Historical Provider, MD   Triage vitals: BP 135/85 mmHg  Pulse 66  Temp(Src) 98.8 F (37.1 C) (Oral)  Resp 16  SpO2 100% Physical Exam  Constitutional: She is oriented to person, place, and time. She appears well-developed  and well-nourished. No distress.  Moist mucous membranes Belly is soft No stridor or bruising DTR is good Intact dorsalis pedis no edema  HENT:  Head: Normocephalic and atraumatic.  Mouth/Throat: Oropharynx is clear and moist.  Eyes: Conjunctivae are normal. Pupils are equal, round, and reactive to light.  Neck: Normal range of motion. Neck supple.  Cardiovascular: Normal rate, regular rhythm and intact distal pulses.   Pulmonary/Chest: Effort normal and breath sounds normal. No respiratory distress. She has no wheezes.  Abdominal: Soft. There is no tenderness. There is no rebound and no guarding.  gassy  Musculoskeletal: Normal range of motion. She exhibits no edema or tenderness.  Neurological: She is alert and oriented to person, place, and time. She has normal reflexes.  Skin: Skin is warm and dry.  Psychiatric: Her mood appears anxious.  Nursing note and vitals reviewed.   ED Course  Procedures (including critical care time) DIAGNOSTIC STUDIES: Oxygen Saturation is 100% on RA, normal by my interpretation.    COORDINATION OF CARE: 12:11 AM Discussed treatment plan with pt at bedside and pt agreed to plan.   Labs Review Labs Reviewed  BASIC METABOLIC PANEL - Abnormal; Notable for the following:    Glucose, Bld 116 (*)    All other components within normal limits  CBC  I-STAT TROPOININ, ED    Imaging Review Dg Chest 2 View  12/07/2015  CLINICAL DATA:  Chest pain between shoulder blades. Epigastric pain this morning. Jaw pain. EXAM: CHEST  2 VIEW COMPARISON:  None. FINDINGS: The cardiomediastinal contours are normal. The lungs are clear. Pulmonary vasculature is normal. No consolidation, pleural effusion, or pneumothorax. No acute osseous abnormalities are seen. IMPRESSION: No acute pulmonary process. Electronically Signed   By: Rubye Oaks M.D.   On: 12/07/2015 23:09   I have personally reviewed and evaluated these images and lab results as part of my medical  decision-making.   EKG Interpretation   Date/Time:  Monday Dec 07 2015 22:34:20 EDT Ventricular Rate:  83 PR Interval:  122 QRS Duration: 82 QT Interval:  382 QTC Calculation: 448 R Axis:   23 Text Interpretation:  Normal sinus rhythm Confirmed by [facility]  MD,  Alontae Chaloux ([id number]) on 12/07/2015 11:39:49 PM      MDM   Final diagnoses:  None    Filed Vitals:   12/07/15 2237 12/08/15 0009  BP: 149/89 135/85  Pulse: 93 66  Temp: 98.8 F (37.1 C)   Resp: 16    Results for orders placed or performed during the hospital encounter of 12/07/15  Basic metabolic panel  Result Value Ref Range   Sodium 137 135 - 145 mmol/L   Potassium 3.6 3.5 - 5.1 mmol/L   Chloride 105 101 - 111 mmol/L   CO2 25 22 - 32 mmol/L   Glucose, Bld 116 (H) 65 - 99 mg/dL   BUN 16 6 - 20 mg/dL   Creatinine, Ser 1.91 0.44 - 1.00 mg/dL   Calcium 9.5 8.9 -  10.3 mg/dL   GFR calc non Af Amer >60 >60 mL/min   GFR calc Af Amer >60 >60 mL/min   Anion gap 7 5 - 15  CBC  Result Value Ref Range   WBC 10.3 4.0 - 10.5 K/uL   RBC 4.23 3.87 - 5.11 MIL/uL   Hemoglobin 12.8 12.0 - 15.0 g/dL   HCT 16.1 09.6 - 04.5 %   MCV 95.0 78.0 - 100.0 fL   MCH 30.3 26.0 - 34.0 pg   MCHC 31.8 30.0 - 36.0 g/dL   RDW 40.9 81.1 - 91.4 %   Platelets 312 150 - 400 K/uL  D-dimer, quantitative (not at [facility])  Result Value Ref Range   D-Dimer, Quant <0.27 0.00 - 0.50 ug/mL-FEU  I-stat troponin, ED  Result Value Ref Range   Troponin i, poc 0.00 0.00 - 0.08 ng/mL   Comment 3           Dg Chest 2 View  12/07/2015  CLINICAL DATA:  Chest pain between shoulder blades. Epigastric pain this morning. Jaw pain. EXAM: CHEST  2 VIEW COMPARISON:  None. FINDINGS: The cardiomediastinal contours are normal. The lungs are clear. Pulmonary vasculature is normal. No consolidation, pleural effusion, or pneumothorax. No acute osseous abnormalities are seen. IMPRESSION: No acute pulmonary process. Electronically Signed   By: Rubye Oaks M.D.   On:  12/07/2015 23:09    Medications  ketorolac (TORADOL) injection 60 mg (not administered)  gi cocktail (Maalox,Lidocaine,Donnatal) (30 mLs Oral Given 12/08/15 0045)  aspirin chewable tablet 162 mg (162 mg Oral Given 12/08/15 0044)    Ruled out for A PE in the ED.  Ruled out for MI with 2 negative troponins and a negative EKG.  Given symptoms have been going on for months I believe this is consistent with anxiety.    I personally performed the services described in this documentation, which was scribed in my presence. The recorded information has been reviewed and is accurate.      Cy Blamer, MD 12/08/15 [phone]

## 2015-12-07 NOTE — ED Notes (Signed)
Pt. reports intermittent central chest pain with palpitations onset today radiating to upper back and bilateral jaw , mild SOB , nausea and diaphoresis .

## 2015-12-08 ENCOUNTER — Encounter (HOSPITAL_COMMUNITY): Payer: Self-pay | Admitting: Emergency Medicine

## 2015-12-08 LAB — I-STAT TROPONIN, ED: Troponin i, poc: 0 ng/mL (ref 0.00–0.08)

## 2015-12-08 LAB — D-DIMER, QUANTITATIVE (NOT AT ARMC)

## 2015-12-08 MED ORDER — GI COCKTAIL ~~LOC~~
30.0000 mL | Freq: Once | ORAL | Status: AC
  Administered 2015-12-08: 30 mL via ORAL
  Filled 2015-12-08: qty 30

## 2015-12-08 MED ORDER — KETOROLAC TROMETHAMINE 60 MG/2ML IM SOLN
60.0000 mg | Freq: Once | INTRAMUSCULAR | Status: AC
  Administered 2015-12-08: 60 mg via INTRAMUSCULAR
  Filled 2015-12-08: qty 2

## 2015-12-08 MED ORDER — ASPIRIN 81 MG PO CHEW
162.0000 mg | CHEWABLE_TABLET | Freq: Once | ORAL | Status: AC
  Administered 2015-12-08: 162 mg via ORAL
  Filled 2015-12-08: qty 2

## 2015-12-08 NOTE — Discharge Instructions (Signed)

## 2015-12-08 NOTE — ED Notes (Signed)
Patient Alert and oriented X4. Stable and ambulatory. Patient verbalized understanding of the discharge instructions.  Patient belongings were taken by the patient.  

## 2016-02-09 ENCOUNTER — Other Ambulatory Visit: Payer: Self-pay | Admitting: Obstetrics and Gynecology

## 2016-02-09 DIAGNOSIS — R928 Other abnormal and inconclusive findings on diagnostic imaging of breast: Secondary | ICD-10-CM

## 2016-02-16 ENCOUNTER — Ambulatory Visit
Admission: RE | Admit: 2016-02-16 | Discharge: 2016-02-16 | Disposition: A | Discharge status: Home / Self Care | Payer: 59 | Source: Ambulatory Visit | Attending: Obstetrics and Gynecology | Admitting: Obstetrics and Gynecology

## 2016-02-16 DIAGNOSIS — R928 Other abnormal and inconclusive findings on diagnostic imaging of breast: Secondary | ICD-10-CM

## 2017-03-01 ENCOUNTER — Other Ambulatory Visit: Payer: Self-pay | Admitting: Obstetrics and Gynecology

## 2017-03-01 DIAGNOSIS — R928 Other abnormal and inconclusive findings on diagnostic imaging of breast: Secondary | ICD-10-CM

## 2017-03-10 ENCOUNTER — Ambulatory Visit
Admission: RE | Admit: 2017-03-10 | Discharge: 2017-03-10 | Disposition: A | Discharge status: Home / Self Care | Payer: 59 | Source: Ambulatory Visit | Attending: Obstetrics and Gynecology | Admitting: Obstetrics and Gynecology

## 2017-03-10 DIAGNOSIS — R928 Other abnormal and inconclusive findings on diagnostic imaging of breast: Secondary | ICD-10-CM

## 2017-08-05 IMAGING — DX DG CHEST 2V
2 series · 2 of 2 positions shown · non-contrast
Comparison: None.

CLINICAL DATA: Chest pain between shoulder blades. Epigastric pain
this morning. Jaw pain.

EXAM:
CHEST  2 VIEW

[chest pa]
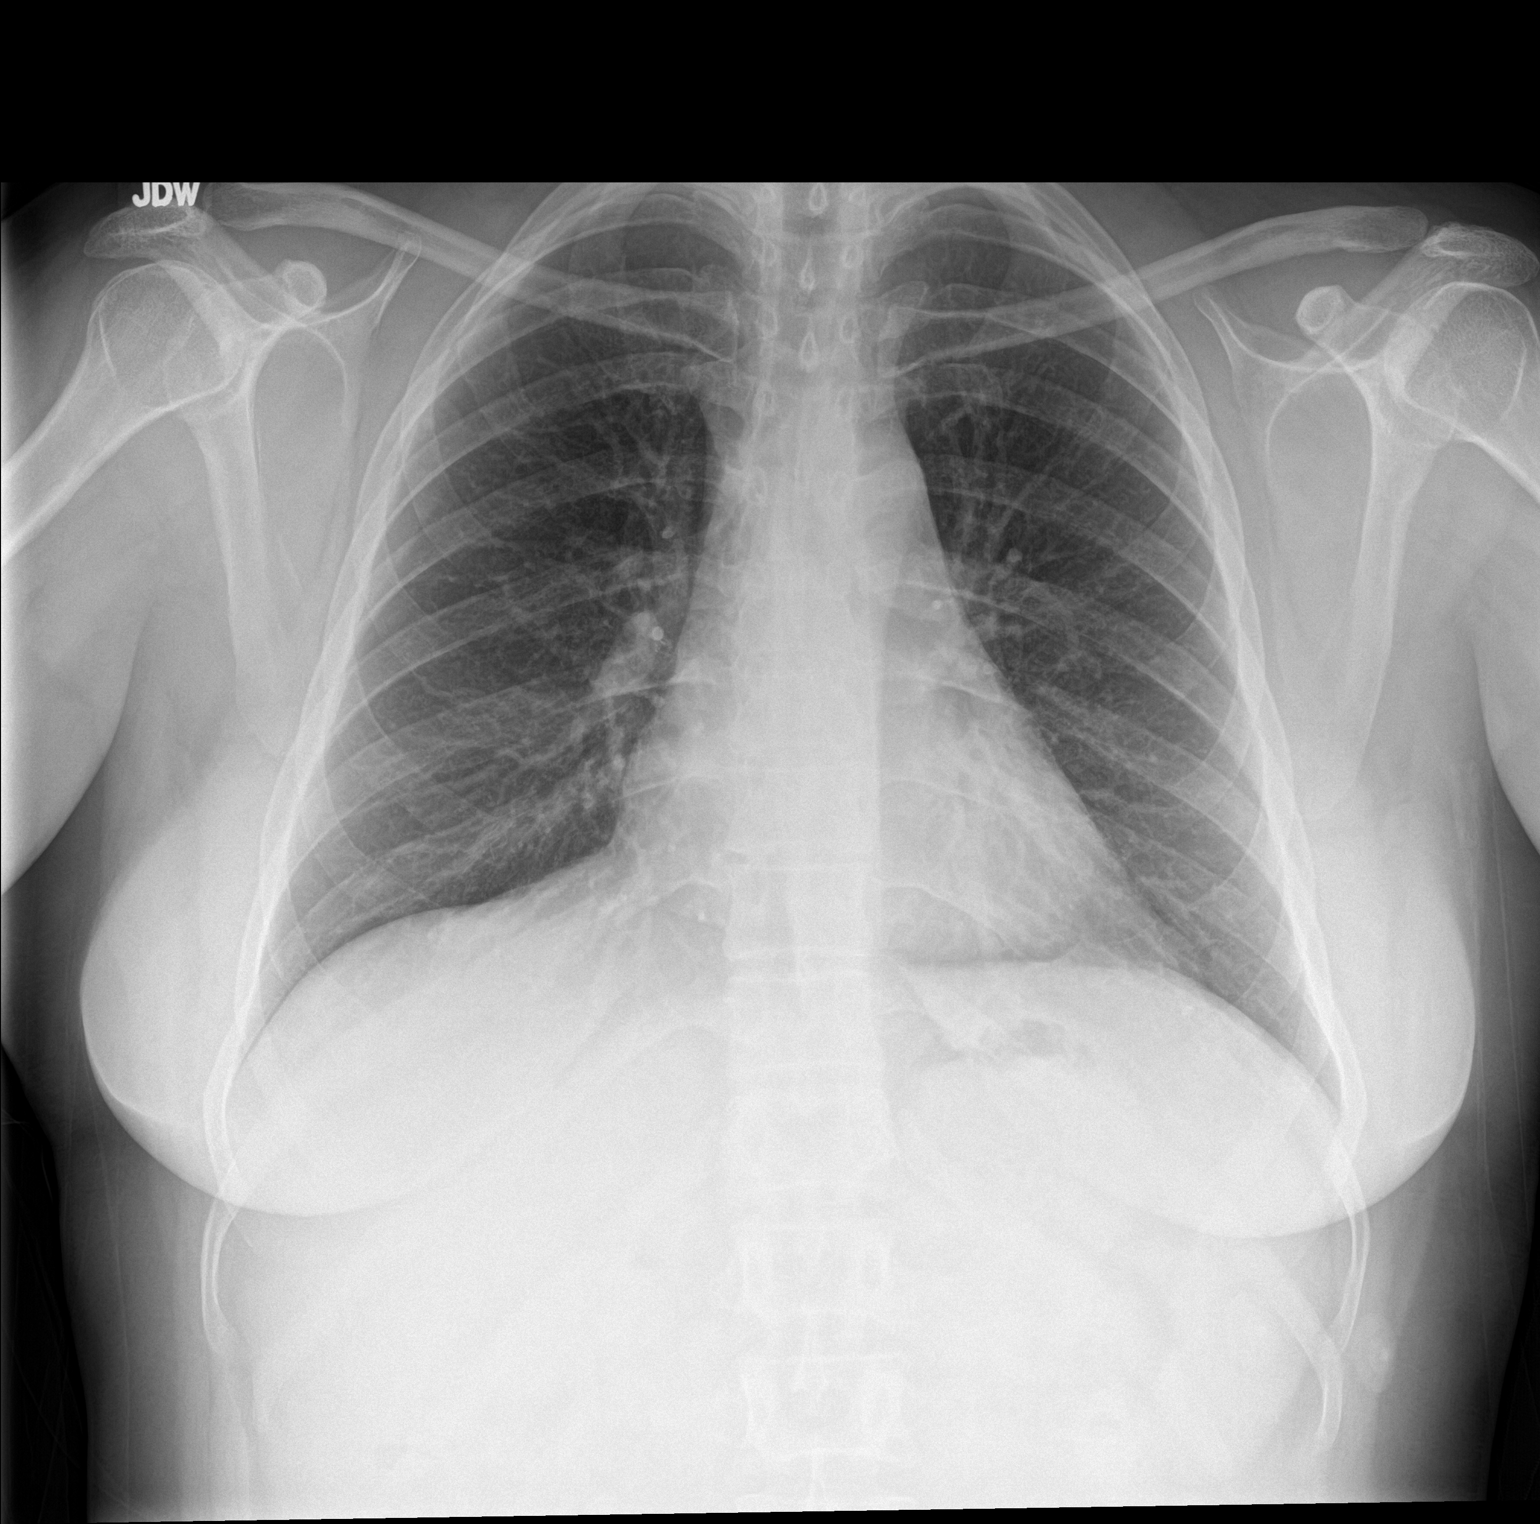

[chest lat]
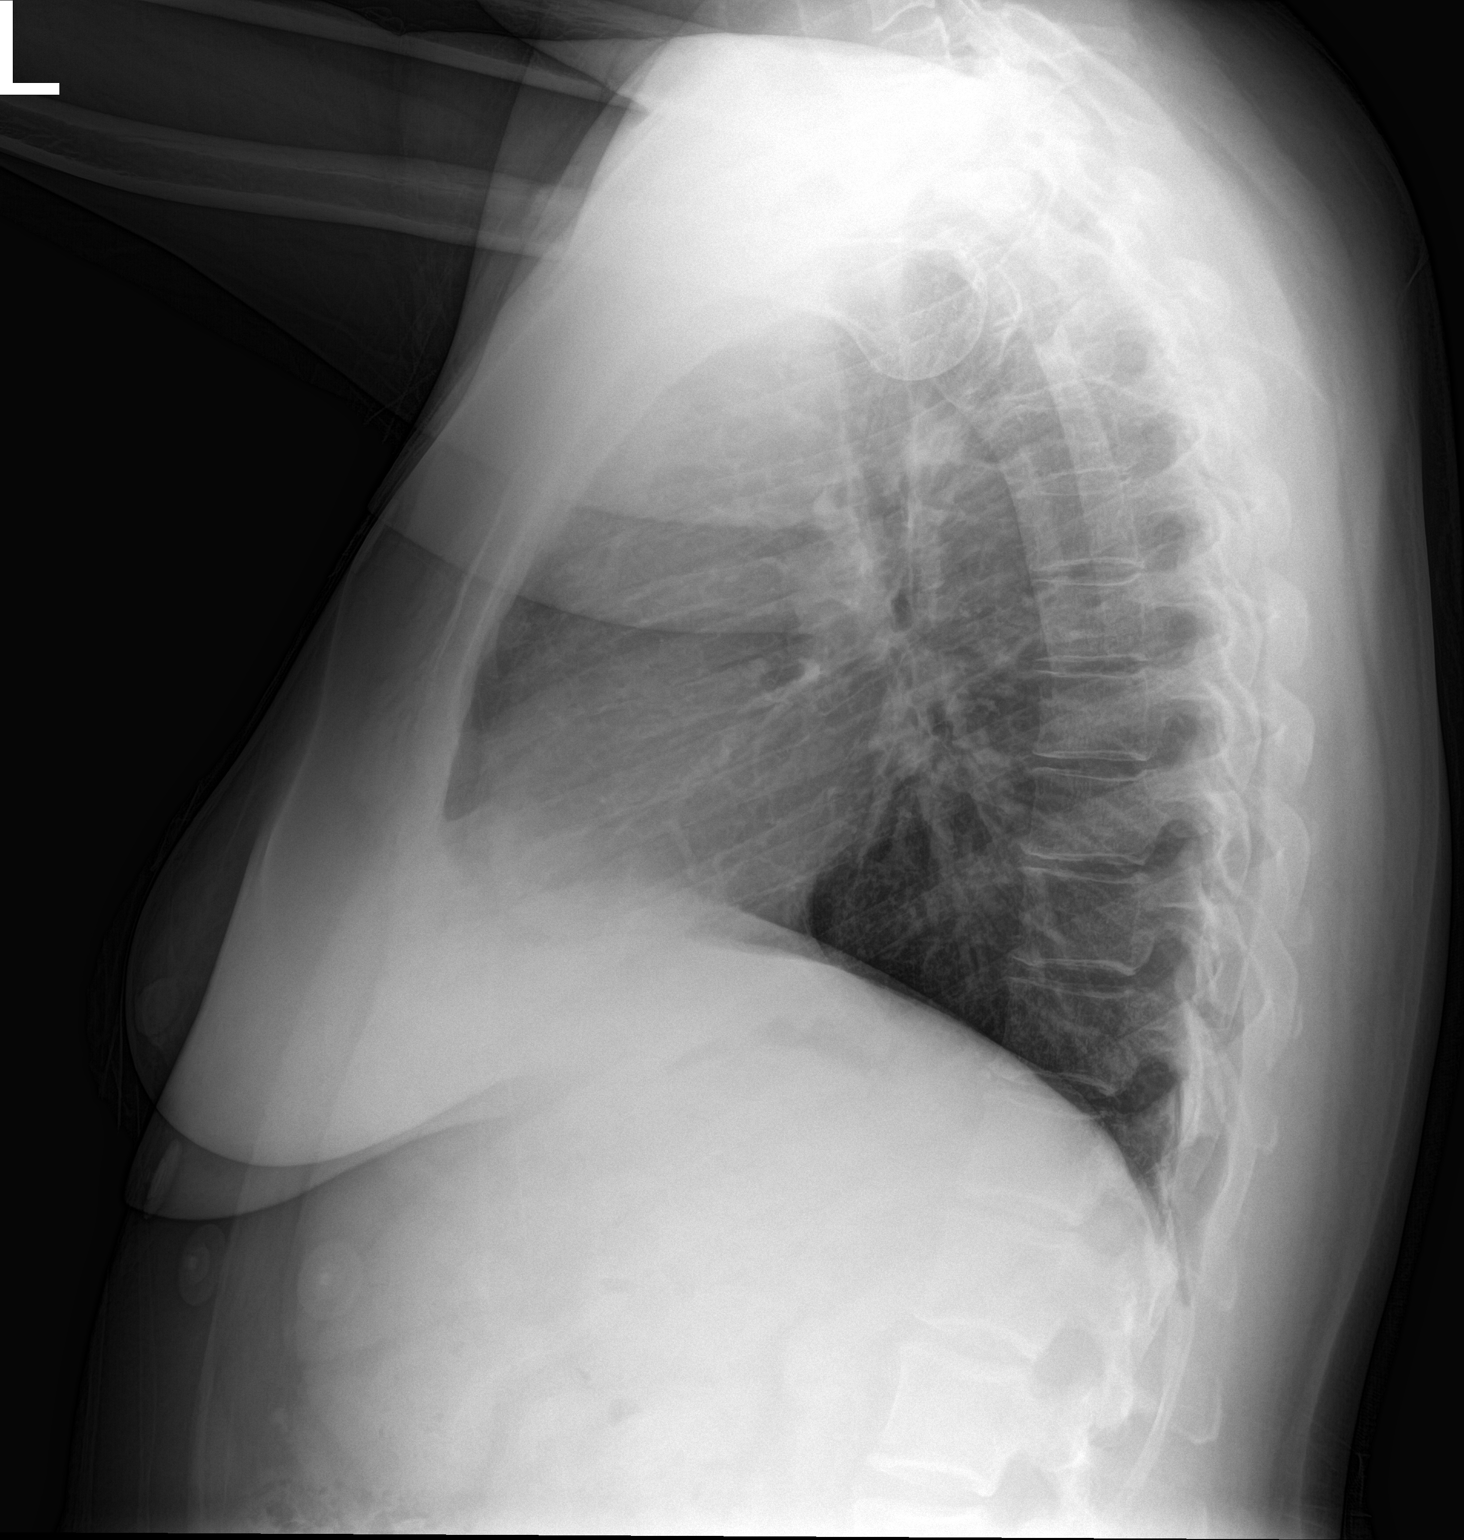

[2 of 2 positions shown; findings below may reference images not displayed]

FINDINGS: The cardiomediastinal contours are normal. The lungs are clear.
Pulmonary vasculature is normal. No consolidation, pleural effusion,
or pneumothorax. No acute osseous abnormalities are seen.
IMPRESSION: No acute pulmonary process.

## 2018-06-12 ENCOUNTER — Encounter: Payer: Self-pay | Admitting: Internal Medicine

## 2018-06-22 ENCOUNTER — Encounter: Payer: Self-pay | Admitting: Internal Medicine

## 2018-06-22 ENCOUNTER — Ambulatory Visit (INDEPENDENT_AMBULATORY_CARE_PROVIDER_SITE_OTHER): Payer: 59 | Admitting: Internal Medicine

## 2018-06-22 VITALS — BP 164/89 | HR 63 | Ht 65.0 in | Wt 165.4 lb

## 2018-06-22 DIAGNOSIS — R002 Palpitations: Secondary | ICD-10-CM | POA: Diagnosis not present

## 2018-06-22 DIAGNOSIS — Z8249 Family history of ischemic heart disease and other diseases of the circulatory system: Secondary | ICD-10-CM

## 2018-06-22 DIAGNOSIS — E785 Hyperlipidemia, unspecified: Secondary | ICD-10-CM

## 2018-06-22 NOTE — Patient Instructions (Signed)
Testing/Procedures: Dr. Rennis GoldenHilty has ordered a CT coronary calcium score. This test [address] done at [address]. This is $150 out of pocket.    Coronary CalciumScan A coronary calcium scan is an imaging test used to look for deposits of calcium and other fatty materials (plaques) in the inner lining of the blood vessels of the heart (coronary arteries). These deposits of calcium and plaques can partly clog and narrow the coronary arteries without producing any symptoms or warning signs. This puts a person at risk for a heart attack. This test can detect these deposits before symptoms develop. Tell a health care provider about:  Any allergies you have.  All medicines you are taking, including vitamins, herbs, eye drops, creams, and over-the-counter medicines.  Any problems you or family members have had with anesthetic medicines.  Any blood disorders you have.  Any surgeries you have had.  Any medical conditions you have.  Whether you are pregnant or may be pregnant. What are the risks? Generally, this is a safe procedure. However, problems may occur, including:  Harm to a pregnant woman and her unborn baby. This test involves the use of radiation. Radiation exposure can be dangerous to a pregnant woman and her unborn baby. If you are pregnant, you generally should not have this procedure done.  Slight increase in the risk of cancer. This is because of the radiation involved in the test. What happens before the procedure? No preparation is needed for this procedure. What happens during the procedure?  You will undress and remove any jewelry around your neck or chest.  You will put on a hospital gown.  Sticky electrodes will be placed on your chest. The electrodes will be connected to an electrocardiogram (ECG) machine to record a tracing of the electrical activity of your heart.  A CT scanner will take pictures of your heart. During this time, you will be asked to lie  still and hold your breath for 2-3 seconds while a picture of your heart is being taken. The procedure may vary among health care providers and hospitals. What happens after the procedure?  You can get dressed.  You can return to your normal activities.  It is up to you to get the results of your test. Ask your health care provider, or the department that is doing the test, when your results will be ready. Summary  A coronary calcium scan is an imaging test used to look for deposits of calcium and other fatty materials (plaques) in the inner lining of the blood vessels of the heart (coronary arteries).  Generally, this is a safe procedure. Tell your health care provider if you are pregnant or may be pregnant.  No preparation is needed for this procedure.  A CT scanner will take pictures of your heart.  You can return to your normal activities after the scan is done. This information is not intended to replace advice given to you by your health care provider. Make sure you discuss any questions you have with your health care provider. Document Released: 12/24/2007 Document Revised: 05/16/2016 Document Reviewed: 05/16/2016 Elsevier Interactive Patient Education  2017 [employer]    Follow-Up: At [facility], you and your health needs are our priority.  As part of our continuing mission to provide you with exceptional heart care, we have created designated Provider Care Teams.  These Care Teams include your primary Cardiologist (physician) and Advanced Practice Providers (APPs -  Physician Assistants and Nurse Practitioners) who  all work together to provide you with the care you need, when you need it. You will need a follow up appointment after your coronary calcium score. You may see Dr. Rennis Golden or one of the following Advanced Practice Providers on your designated Care Team: Azalee Course, [place] . Micah Flesher, PA-C  Any Other Special Instructions Will Be Listed Below (If  Applicable).

## 2018-06-25 ENCOUNTER — Encounter: Payer: Self-pay | Admitting: Internal Medicine

## 2018-06-25 NOTE — Progress Notes (Signed)
LIPID CLINIC CONSULT NOTE  Chief Complaint:  Dyslipidemia  Primary Care Physician: Patient, No Pcp Per  HPI:  Tracey Olson is a 47 y.o. female who is being seen today for the evaluation of dyslipidemia and cardiac risk at the request of Candice Camp, MD.  This is a pleasant 47 year old female was calmly referred for evaluation of dyslipidemia and cardiovascular risk by her OB/GYN.  She has a family history of heart disease in her grandfather but no significant disease in first-degree relatives.  Recently she had a lipid profile which showed total cholesterol 249, HDL 50, triglycerides 212 and LDL of 161.  She is known that she set up for elevated cholesterol in the past but has not been on treatment.  In addition her blood pressure is elevated today.  She does not have a diagnosis of hypertension.  She denies chest pain or worsening shortness of breath.  She has reported some palpitations.  At times she feels like her heart races both at exertion and at rest.  She has tried to alter her diet without any improvement.  She was seen for these palpitations in 2017 in the ER was noted to have some PACs.  PMHx:  Past Medical History:  Diagnosis Date  . Anxiety   . Asthma    allergy testing Dr Ephriam Knuckles?  Marland Kitchen Environmental allergies   . GERD (gastroesophageal reflux disease)   . Hypertension   . IBS (irritable bowel syndrome)     Past Surgical History:  Procedure Laterality Date  . BREAST EXCISIONAL BIOPSY Left   . BREAST SURGERY Left 1990   lumpectomy  . COLON SURGERY  08/2013   polyp removal  . TUBAL LIGATION  2007    FAMHx:  Family History  Problem Relation Age of Onset  . Heart disease Maternal Grandfather   . Heart disease Paternal Grandfather   . Cancer Maternal Grandmother     SOCHx:   reports that she has never smoked. She has never used smokeless tobacco. She reports current alcohol use. She reports that she does not use drugs.  ALLERGIES:  Allergies  Allergen  Reactions  . Penicillins Cross Reactors Other (See Comments)    Swelling around mouth, lips    ROS: Pertinent items noted in HPI and remainder of comprehensive ROS otherwise negative.  HOME MEDS: Current Outpatient Medications on File Prior to Visit  Medication Sig Dispense Refill  . albuterol (PROVENTIL HFA;VENTOLIN HFA) 108 (90 BASE) MCG/ACT inhaler Inhale 2 puffs into the lungs every 6 (six) hours as needed. (Patient taking differently: Inhale 2 puffs into the lungs every 6 (six) hours as needed for wheezing or shortness of breath. ) 1 Inhaler 3  . chlorpheniramine (CHLOR-TRIMETON) 4 MG tablet Take 4 mg by mouth 2 (two) times daily as needed for allergies.    Marland Kitchen omeprazole (PRILOSEC) 20 MG capsule Take 20 mg by mouth daily.     No current facility-administered medications on file prior to visit.     LABS/IMAGING: No results found for this or any previous visit (from the past 48 hour(s)). No results found.  LIPID PANEL: No results found for: CHOL, TRIG, HDL, CHOLHDL, VLDL, LDLCALC, LDLDIRECT  WEIGHTS: Wt Readings from Last 3 Encounters:  06/22/18 165 lb 6.4 oz (75 kg)  01/03/15 178 lb 3.2 oz (80.8 kg)  12/31/14 179 lb (81.2 kg)    VITALS: BP (!) 164/89   Pulse 63   Ht 5\' 5"  (1.651 m)   Wt 165 lb 6.4 oz (  75 kg)   BMI 27.52 kg/m   EXAM: General appearance: alert and no distress Neck: no carotid bruit, no JVD and thyroid not enlarged, symmetric, no tenderness/mass/nodules Lungs: clear to auscultation bilaterally Heart: regular rate and rhythm Abdomen: soft, non-tender; bowel sounds normal; no masses,  no organomegaly Extremities: extremities normal, atraumatic, no cyanosis or edema Pulses: 2+ and symmetric Skin: Skin color, texture, turgor normal. No rashes or lesions Neurologic: Grossly normal Psych: Pleasant  EKG: Normal sinus rhythm 63- personally reviewed  ASSESSMENT: 1. Dyslipidemia 2. Palpitations -likely PACs. 3. Family history of heart  disease  PLAN: 1.   Ms. Tracey Olson has dyslipidemia with an unclear target for her LDL cholesterol.  I suspect she is at intermediate risk based on her risk factors, age and family history although since she is young she would be unlikely to have developed any atherosclerosis at this point.  It would be helpful to have a coronary artery calcium score to further risk stratify her.  If this is abnormal then her goal LDL would be much lower at 70 rather than 100 and I would advocate not only for dietary changes to lower cholesterol but also likely medical therapy.  Her palpitations have largely resolved and were noted to be PACs likely by ER visit in 2017.  Plan follow-up with me after a calcium score for further consideration of lipid management. Thanks again for the kind referral.  Chrystie NoseKenneth C. Suad Autrey, MD, [facility] Hitch[facility] ClinicFACC, FACP  [place]  [facility]  Medical Director of the [facility]  [facility] Diplomate of the American Board of Clinical Lipidology Attending Cardiologist  Direct Dial: [phone]  Fax: [phone]  Website:  [email or web address]  Allyana Vogan C Maanasa Aderhold 06/25/2018, 1:31 PM

## 2018-07-13 ENCOUNTER — Ambulatory Visit (INDEPENDENT_AMBULATORY_CARE_PROVIDER_SITE_OTHER)
Admission: RE | Admit: 2018-07-13 | Discharge: 2018-07-13 | Disposition: A | Discharge status: Home / Self Care | Payer: Self-pay | Source: Ambulatory Visit | Attending: Internal Medicine | Admitting: Internal Medicine

## 2018-07-13 DIAGNOSIS — Z8249 Family history of ischemic heart disease and other diseases of the circulatory system: Secondary | ICD-10-CM

## 2018-07-13 DIAGNOSIS — E785 Hyperlipidemia, unspecified: Secondary | ICD-10-CM

## 2018-07-19 ENCOUNTER — Ambulatory Visit: Payer: 59 | Admitting: Cardiology

## 2019-06-05 ENCOUNTER — Other Ambulatory Visit: Payer: Self-pay

## 2019-06-05 DIAGNOSIS — Z20822 Contact with and (suspected) exposure to covid-19: Secondary | ICD-10-CM

## 2019-06-06 LAB — NOVEL CORONAVIRUS, NAA: SARS-CoV-2, NAA: NOT DETECTED

## 2019-06-07 ENCOUNTER — Encounter (HOSPITAL_COMMUNITY): Payer: Self-pay

## 2019-06-07 ENCOUNTER — Inpatient Hospital Stay (HOSPITAL_COMMUNITY)
Admission: EM | Admit: 2019-06-07 | Discharge: 2019-06-09 | DRG: 392 | Disposition: A | Discharge status: Home / Self Care | Payer: Managed Care, Other (non HMO) | Attending: Family Medicine | Admitting: Family Medicine

## 2019-06-07 ENCOUNTER — Other Ambulatory Visit: Payer: Self-pay

## 2019-06-07 ENCOUNTER — Emergency Department (HOSPITAL_COMMUNITY): Payer: Managed Care, Other (non HMO)

## 2019-06-07 DIAGNOSIS — J45909 Unspecified asthma, uncomplicated: Secondary | ICD-10-CM | POA: Diagnosis present

## 2019-06-07 DIAGNOSIS — E876 Hypokalemia: Secondary | ICD-10-CM | POA: Diagnosis present

## 2019-06-07 DIAGNOSIS — T50995A Adverse effect of other drugs, medicaments and biological substances, initial encounter: Secondary | ICD-10-CM | POA: Diagnosis present

## 2019-06-07 DIAGNOSIS — Y92239 Unspecified place in hospital as the place of occurrence of the external cause: Secondary | ICD-10-CM | POA: Diagnosis present

## 2019-06-07 DIAGNOSIS — K219 Gastro-esophageal reflux disease without esophagitis: Secondary | ICD-10-CM | POA: Diagnosis present

## 2019-06-07 DIAGNOSIS — L271 Localized skin eruption due to drugs and medicaments taken internally: Secondary | ICD-10-CM | POA: Diagnosis not present

## 2019-06-07 DIAGNOSIS — K589 Irritable bowel syndrome without diarrhea: Secondary | ICD-10-CM | POA: Diagnosis present

## 2019-06-07 DIAGNOSIS — I10 Essential (primary) hypertension: Secondary | ICD-10-CM | POA: Diagnosis present

## 2019-06-07 DIAGNOSIS — K529 Noninfective gastroenteritis and colitis, unspecified: Principal | ICD-10-CM | POA: Diagnosis present

## 2019-06-07 DIAGNOSIS — R195 Other fecal abnormalities: Secondary | ICD-10-CM | POA: Diagnosis present

## 2019-06-07 DIAGNOSIS — T508X5A Adverse effect of diagnostic agents, initial encounter: Secondary | ICD-10-CM | POA: Diagnosis not present

## 2019-06-07 DIAGNOSIS — Z23 Encounter for immunization: Secondary | ICD-10-CM

## 2019-06-07 DIAGNOSIS — Z20828 Contact with and (suspected) exposure to other viral communicable diseases: Secondary | ICD-10-CM | POA: Diagnosis present

## 2019-06-07 LAB — COMPREHENSIVE METABOLIC PANEL
ALT: 21 U/L (ref 0–44)
AST: 15 U/L (ref 15–41)
Albumin: 4.3 g/dL (ref 3.5–5.0)
Alkaline Phosphatase: 33 U/L — ABNORMAL LOW (ref 38–126)
Anion gap: 9 (ref 5–15)
BUN: 12 mg/dL (ref 6–20)
CO2: 24 mmol/L (ref 22–32)
Calcium: 9.1 mg/dL (ref 8.9–10.3)
Chloride: 105 mmol/L (ref 98–111)
Creatinine, Ser: 0.79 mg/dL (ref 0.44–1.00)
GFR calc Af Amer: >60 mL/min (ref >60)
GFR calc non Af Amer: >60 mL/min (ref >60)
Glucose, Bld: 131 mg/dL — ABNORMAL HIGH (ref 70–99)
Potassium: 3.3 mmol/L — ABNORMAL LOW (ref 3.5–5.1)
Sodium: 138 mmol/L (ref 135–145)
Total Bilirubin: 0.6 mg/dL (ref 0.3–1.2)
Total Protein: 7.4 g/dL (ref 6.5–8.1)

## 2019-06-07 LAB — URINALYSIS, ROUTINE W REFLEX MICROSCOPIC
Bilirubin Urine: NEGATIVE
Glucose, UA: NEGATIVE mg/dL
Hgb urine dipstick: NEGATIVE
Ketones, ur: NEGATIVE mg/dL
Leukocytes,Ua: NEGATIVE
Nitrite: NEGATIVE
Protein, ur: NEGATIVE mg/dL
Specific Gravity, Urine: 1.023 (ref 1.005–1.030)
pH: 5 (ref 5.0–8.0)

## 2019-06-07 LAB — I-STAT BETA HCG BLOOD, ED (MC, WL, AP ONLY): I-stat hCG, quantitative: <5 m[IU]/mL (ref <5)

## 2019-06-07 LAB — CBC
HCT: 42.5 % (ref 36.0–46.0)
Hemoglobin: 14.2 g/dL (ref 12.0–15.0)
MCH: 31.5 pg (ref 26.0–34.0)
MCHC: 33.4 g/dL (ref 30.0–36.0)
MCV: 94.2 fL (ref 80.0–100.0)
Platelets: 244 10*3/uL (ref 150–400)
RBC: 4.51 MIL/uL (ref 3.87–5.11)
RDW: 12.3 % (ref 11.5–15.5)
WBC: 13.6 10*3/uL — ABNORMAL HIGH (ref 4.0–10.5)
nRBC: 0 % (ref 0.0–0.2)

## 2019-06-07 LAB — LACTIC ACID, PLASMA: Lactic Acid, Venous: 1 mmol/L (ref 0.5–1.9)

## 2019-06-07 LAB — POC OCCULT BLOOD, ED: Fecal Occult Bld: POSITIVE — AB

## 2019-06-07 LAB — LIPASE, BLOOD: Lipase: 30 U/L (ref 11–51)

## 2019-06-07 MED ORDER — DIPHENHYDRAMINE HCL 50 MG/ML IJ SOLN
50.0000 mg | Freq: Once | INTRAMUSCULAR | Status: AC
  Administered 2019-06-07: 50 mg via INTRAVENOUS
  Filled 2019-06-07: qty 1

## 2019-06-07 MED ORDER — HYDROMORPHONE HCL 1 MG/ML IJ SOLN
1.0000 mg | Freq: Once | INTRAMUSCULAR | Status: AC
  Administered 2019-06-07: 1 mg via INTRAVENOUS
  Filled 2019-06-07: qty 1

## 2019-06-07 MED ORDER — ONDANSETRON HCL 4 MG/2ML IJ SOLN
4.0000 mg | Freq: Once | INTRAMUSCULAR | Status: AC | PRN
  Administered 2019-06-08: 23:00:00 4 mg via INTRAVENOUS
  Filled 2019-06-07: qty 2

## 2019-06-07 MED ORDER — EPINEPHRINE 0.3 MG/0.3ML IJ SOAJ
0.3000 mg | Freq: Once | INTRAMUSCULAR | Status: AC
  Administered 2019-06-07: 0.3 mg via INTRAMUSCULAR
  Filled 2019-06-07: qty 0.3

## 2019-06-07 MED ORDER — IOHEXOL 350 MG/ML SOLN
100.0000 mL | Freq: Once | INTRAVENOUS | Status: AC | PRN
  Administered 2019-06-07: 100 mL via INTRAVENOUS

## 2019-06-07 MED ORDER — LACTATED RINGERS IV BOLUS
1000.0000 mL | Freq: Once | INTRAVENOUS | Status: AC
  Administered 2019-06-07: 1000 mL via INTRAVENOUS

## 2019-06-07 MED ORDER — SODIUM CHLORIDE (PF) 0.9 % IJ SOLN
INTRAMUSCULAR | Status: AC
  Filled 2019-06-07: qty 50

## 2019-06-07 MED ORDER — HYDROCORTISONE NA SUCCINATE PF 250 MG IJ SOLR
200.0000 mg | Freq: Once | INTRAMUSCULAR | Status: AC
  Administered 2019-06-07: 19:00:00 200 mg via INTRAVENOUS
  Filled 2019-06-07: qty 200

## 2019-06-07 MED ORDER — SODIUM CHLORIDE 0.9% FLUSH
3.0000 mL | Freq: Once | INTRAVENOUS | Status: AC
  Administered 2019-06-07: 3 mL via INTRAVENOUS

## 2019-06-07 MED ORDER — DIPHENHYDRAMINE HCL 50 MG/ML IJ SOLN: 50.0000 mg | Freq: Once | INTRAMUSCULAR | Status: AC

## 2019-06-07 MED ORDER — HYDROMORPHONE HCL 1 MG/ML IJ SOLN
1.0000 mg | INTRAMUSCULAR | Status: AC | PRN
  Administered 2019-06-07 – 2019-06-08 (×2): 1 mg via INTRAVENOUS
  Filled 2019-06-07 (×2): qty 1

## 2019-06-07 MED ORDER — DIPHENHYDRAMINE HCL 25 MG PO CAPS
50.0000 mg | ORAL_CAPSULE | Freq: Once | ORAL | Status: AC
  Administered 2019-06-07: 50 mg via ORAL
  Filled 2019-06-07: qty 2

## 2019-06-07 MED ORDER — LACTATED RINGERS IV SOLN
INTRAVENOUS | Status: DC
  Administered 2019-06-07: 20:00:00 via INTRAVENOUS

## 2019-06-07 MED ORDER — LACTATED RINGERS IV BOLUS
1000.0000 mL | Freq: Once | INTRAVENOUS | Status: AC
  Administered 2019-06-07: 19:00:00 1000 mL via INTRAVENOUS

## 2019-06-07 NOTE — ED Triage Notes (Signed)
Pt presents with c/o abdominal pain/cramping and rectal bleeding that started yesterday. Pt reports she did have a fever last week and was tested for Covid, negative result. Pt has a hx of IBS.

## 2019-06-07 NOTE — ED Notes (Signed)
Per CT: CT unable to do scan with contrast due to anaphylactic reaction. MD made aware

## 2019-06-07 NOTE — ED Notes (Signed)
Pt reports reaction of oral swelling and facial swelling from previous Contrast administration 8-10 years ago. MD made aware

## 2019-06-07 NOTE — ED Notes (Signed)
Pt provided labeled specimen cup for U/A collection per MD order. ENMiles 

## 2019-06-07 NOTE — ED Provider Notes (Addendum)
[facility]   CSN: [id number] Arrival date & time: 06/07/19  1432     History   Chief Complaint Chief Complaint  Patient presents with  . Rectal Bleeding  . Emesis    HPI Tracey Olson is a 48 y.o. female.     HPI  48 year old female presents with abdominal pain and rectal bleeding.  Patient states that yesterday evening she noticed sudden onset abdominal pain in her lower abdomen.  Felt like she had to go to the bathroom and had an episode of vomiting but also a bowel movement.  Throughout the night the abdominal pain has been present and waxing and waning in intensity.  Is about a 5 or 6 but then goes up to an 8 or 9 when it spasms.  It is like a cramping.  She states she has been having low-grade fever for about a week.  Temp up to 99.5.  Had a Covid test that was negative a couple days ago.  Previous to yesterday she has not had any symptoms.  Since last night she has been having blood per rectum.  She states there is no stool but she has had almost 10 episodes of having to go the bathroom and then having blood.  States she has had numerous colonoscopies, the most recent about 5 years ago, due to precancerous polyps.  Past Medical History:  Diagnosis Date  . Anxiety   . Asthma    allergy testing Dr Ephriam KnucklesSharma-2008?  Marland Kitchen. Environmental allergies   . GERD (gastroesophageal reflux disease)   . Hypertension   . IBS (irritable bowel syndrome)     Patient Active Problem List   Diagnosis Date Noted  . Chronic cough 09/30/2013    Past Surgical History:  Procedure Laterality Date  . BREAST EXCISIONAL BIOPSY Left   . BREAST SURGERY Left 1990   lumpectomy  . COLON SURGERY  08/2013   polyp removal  . TUBAL LIGATION  2007     OB History   No obstetric history on file.      Home Medications    Prior to Admission medications   Medication Sig Start Date End Date Taking? Authorizing Provider  albuterol (PROVENTIL HFA;VENTOLIN HFA)  108 (90 BASE) MCG/ACT inhaler Inhale 2 puffs into the lungs every 6 (six) hours as needed. Patient taking differently: Inhale 2 puffs into the lungs every 6 (six) hours as needed for wheezing or shortness of breath.  09/26/13  Yes Collene Gobbleaub, Steven A, MD  chlorpheniramine (CHLOR-TRIMETON) 4 MG tablet Take 4 mg by mouth 2 (two) times daily as needed for allergies.   Yes [provider]  omeprazole (PRILOSEC) 20 MG capsule Take 20 mg by mouth daily.   Yes [provider]  Probiotic Product (PROBIOTIC-10 PO) Take 1 tablet by mouth at bedtime.   Yes [provider]  Zinc 100 MG TABS Take 100 mg by mouth at bedtime.   Yes [provider]    Family History Family History  Problem Relation Age of Onset  . Heart disease Maternal Grandfather   . Heart disease Paternal Grandfather   . Cancer Maternal Grandmother     Social History Social History   Tobacco Use  . Smoking status: Never Smoker  . Smokeless tobacco: Never Used  Substance Use Topics  . Alcohol use: Yes  . Drug use: No     Allergies   Penicillins, Contrast media [iodinated diagnostic agents], and Penicillins cross reactors   Review of  Systems Review of Systems  Constitutional: Positive for fever (99).  Gastrointestinal: Positive for abdominal pain, blood in stool, nausea and vomiting. Negative for diarrhea.  Genitourinary: Negative for dysuria.  All other systems reviewed and are negative.    Physical Exam Updated Vital Signs BP (!) 106/50   Pulse 70   Temp 99.3 F (37.4 C) (Oral)   Resp 16   Ht 5\' 6"  (1.676 m)   Wt 74.8 kg   SpO2 98%   BMI 26.63 kg/m   Physical Exam Vitals signs and nursing note reviewed. Exam conducted with a chaperone present.  Constitutional:      General: She is not in acute distress.    Appearance: She is well-developed. She is not ill-appearing or diaphoretic.  HENT:     Head: Normocephalic and atraumatic.     Right Ear: External ear normal.     Left  Ear: External ear normal.     Nose: Nose normal.  Eyes:     General:        Right eye: No discharge.        Left eye: No discharge.  Cardiovascular:     Rate and Rhythm: Normal rate and regular rhythm.     Heart sounds: Normal heart sounds.  Pulmonary:     Effort: Pulmonary effort is normal.     Breath sounds: Normal breath sounds.  Abdominal:     Palpations: Abdomen is soft.     Tenderness: There is abdominal tenderness in the left lower quadrant.  Genitourinary:    Rectum: Guaiac result positive. No mass, tenderness, anal fissure, external hemorrhoid or internal hemorrhoid. Normal anal tone.     Comments: Minimal bright red blood on DRE. No hemorrhoids or masses. Skin:    General: Skin is warm and dry.  Neurological:     Mental Status: She is alert.  Psychiatric:        Mood and Affect: Mood is not anxious.      ED Treatments / Results  Labs (all labs ordered are listed, but only abnormal results are displayed) Labs Reviewed  COMPREHENSIVE METABOLIC PANEL - Abnormal; Notable for the following components:      Result Value   Potassium 3.3 (*)    Glucose, Bld 131 (*)    Alkaline Phosphatase 33 (*)    All other components within normal limits  CBC - Abnormal; Notable for the following components:   WBC 13.6 (*)    All other components within normal limits  POC OCCULT BLOOD, ED - Abnormal; Notable for the following components:   Fecal Occult Bld POSITIVE (*)    All other components within normal limits  LIPASE, BLOOD  URINALYSIS, ROUTINE W REFLEX MICROSCOPIC  LACTIC ACID, PLASMA  LACTIC ACID, PLASMA  I-STAT BETA HCG BLOOD, ED ([facility], [facility], [facility] ONLY)    EKG None  Radiology No results found.  Procedures Procedures (including critical care time)  Medications Ordered in ED Medications  ondansetron (ZOFRAN) injection 4 mg (has no administration in time range)  lactated ringers infusion ( Intravenous New Bag/Given 06/07/19 2019)  HYDROmorphone (DILAUDID) injection 1  mg (1 mg Intravenous Given 06/07/19 2115)  sodium chloride (PF) 0.9 % injection (has no administration in time range)  sodium chloride flush (NS) 0.9 % injection 3 mL (3 mLs Intravenous Given 06/07/19 2019)  HYDROmorphone (DILAUDID) injection 1 mg (1 mg Intravenous Given 06/07/19 1847)  lactated ringers bolus 1,000 mL (1,000 mLs Intravenous Bolus 06/07/19 1856)  lactated ringers bolus 1,000 mL (  1,000 mLs Intravenous Bolus 06/07/19 1856)  hydrocortisone sodium succinate (SOLU-CORTEF) injection 200 mg (200 mg Intravenous Given 06/07/19 1925)  diphenhydrAMINE (BENADRYL) capsule 50 mg (50 mg Oral Given 06/07/19 1927)    Or  diphenhydrAMINE (BENADRYL) injection 50 mg ( Intravenous See Alternative 06/07/19 1927)  diphenhydrAMINE (BENADRYL) injection 50 mg (50 mg Intravenous Given 06/07/19 2257)  iohexol (OMNIPAQUE) 350 MG/ML injection 100 mL (100 mLs Intravenous Contrast Given 06/07/19 2332)     Initial Impression / Assessment and Plan / ED Course  I have reviewed the triage vital signs and the nursing notes.  Pertinent labs & imaging results that were available during my care of the patient were reviewed by me and considered in my medical decision making (see chart for details).  Clinical Course as of Jun 06 2338  Caleen Essex Jun 07, 2019  1914 There is concerned because the patient has had contrast allergy and appears to need CT angiography.  The patient tells me that 8-10 years ago she had contrast and then slowly developed lip/facial swelling on the right side.  Was clearing her throat but never felt like she was having trouble breathing or swallowing or throat closing sensation.  Discussed with Dr. Kearney Hard, should be okay 4-hour protocol and we will watch closely.   [SG]    Clinical Course User Index [SG] Pricilla Loveless, MD       Patient has remained well while in the emergency department.  She has been continued on fluids.  CT currently pending. Care to Dr. Nicanor Alcon, dispo per CT results.    11:51 PM Addendum: Patient is having rash/itching to bilateral periorbital eyes, right ear. Given she has already received benadryl and steroids, there is not much else to treat her with except epi. This might be a bit overkill but she clearly is having a reaction to the contrast. Otherwise no CAD history, low risk for deleterious effects from epi. Does not appear to be in full blow anaphylaxis.   Final Clinical Impressions(s) / ED Diagnoses   Final diagnoses:  None    ED Discharge Orders    None       Pricilla Loveless, MD 06/07/19 9833    Pricilla Loveless, MD 06/07/19 2352

## 2019-06-08 DIAGNOSIS — K589 Irritable bowel syndrome without diarrhea: Secondary | ICD-10-CM | POA: Diagnosis present

## 2019-06-08 DIAGNOSIS — I10 Essential (primary) hypertension: Secondary | ICD-10-CM | POA: Diagnosis present

## 2019-06-08 DIAGNOSIS — K219 Gastro-esophageal reflux disease without esophagitis: Secondary | ICD-10-CM | POA: Diagnosis present

## 2019-06-08 DIAGNOSIS — T508X5A Adverse effect of diagnostic agents, initial encounter: Secondary | ICD-10-CM | POA: Diagnosis not present

## 2019-06-08 DIAGNOSIS — E876 Hypokalemia: Secondary | ICD-10-CM | POA: Diagnosis present

## 2019-06-08 DIAGNOSIS — K529 Noninfective gastroenteritis and colitis, unspecified: Secondary | ICD-10-CM | POA: Diagnosis present

## 2019-06-08 DIAGNOSIS — J45909 Unspecified asthma, uncomplicated: Secondary | ICD-10-CM | POA: Diagnosis present

## 2019-06-08 DIAGNOSIS — R195 Other fecal abnormalities: Secondary | ICD-10-CM | POA: Diagnosis present

## 2019-06-08 DIAGNOSIS — Y92239 Unspecified place in hospital as the place of occurrence of the external cause: Secondary | ICD-10-CM | POA: Diagnosis present

## 2019-06-08 DIAGNOSIS — Z23 Encounter for immunization: Secondary | ICD-10-CM | POA: Diagnosis not present

## 2019-06-08 DIAGNOSIS — T50995A Adverse effect of other drugs, medicaments and biological substances, initial encounter: Secondary | ICD-10-CM | POA: Diagnosis present

## 2019-06-08 DIAGNOSIS — Z20828 Contact with and (suspected) exposure to other viral communicable diseases: Secondary | ICD-10-CM | POA: Diagnosis present

## 2019-06-08 DIAGNOSIS — L271 Localized skin eruption due to drugs and medicaments taken internally: Secondary | ICD-10-CM | POA: Diagnosis not present

## 2019-06-08 LAB — CBC
HCT: 36.8 % (ref 36.0–46.0)
Hemoglobin: 12 g/dL (ref 12.0–15.0)
MCH: 30.9 pg (ref 26.0–34.0)
MCHC: 32.6 g/dL (ref 30.0–36.0)
MCV: 94.8 fL (ref 80.0–100.0)
Platelets: 209 10*3/uL (ref 150–400)
RBC: 3.88 MIL/uL (ref 3.87–5.11)
RDW: 12.3 % (ref 11.5–15.5)
WBC: 11.9 10*3/uL — ABNORMAL HIGH (ref 4.0–10.5)
nRBC: 0 % (ref 0.0–0.2)

## 2019-06-08 LAB — COMPREHENSIVE METABOLIC PANEL
ALT: 17 U/L (ref 0–44)
AST: 15 U/L (ref 15–41)
Albumin: 3.6 g/dL (ref 3.5–5.0)
Alkaline Phosphatase: 25 U/L — ABNORMAL LOW (ref 38–126)
Anion gap: 10 (ref 5–15)
BUN: 8 mg/dL (ref 6–20)
CO2: 21 mmol/L — ABNORMAL LOW (ref 22–32)
Calcium: 8.4 mg/dL — ABNORMAL LOW (ref 8.9–10.3)
Chloride: 105 mmol/L (ref 98–111)
Creatinine, Ser: 0.73 mg/dL (ref 0.44–1.00)
GFR calc Af Amer: >60 mL/min (ref >60)
GFR calc non Af Amer: >60 mL/min (ref >60)
Glucose, Bld: 183 mg/dL — ABNORMAL HIGH (ref 70–99)
Potassium: 3.6 mmol/L (ref 3.5–5.1)
Sodium: 136 mmol/L (ref 135–145)
Total Bilirubin: 0.6 mg/dL (ref 0.3–1.2)
Total Protein: 6.4 g/dL — ABNORMAL LOW (ref 6.5–8.1)

## 2019-06-08 LAB — SARS CORONAVIRUS 2 (TAT 6-24 HRS): SARS Coronavirus 2: NEGATIVE

## 2019-06-08 LAB — HIV ANTIBODY (ROUTINE TESTING W REFLEX): HIV Screen 4th Generation wRfx: NONREACTIVE

## 2019-06-08 MED ORDER — METRONIDAZOLE IN NACL 5-0.79 MG/ML-% IV SOLN
500.0000 mg | Freq: Three times a day (TID) | INTRAVENOUS | Status: DC
  Administered 2019-06-08 – 2019-06-09 (×4): 500 mg via INTRAVENOUS
  Filled 2019-06-08 (×4): qty 100

## 2019-06-08 MED ORDER — ACETAMINOPHEN 325 MG PO TABS
650.0000 mg | ORAL_TABLET | Freq: Four times a day (QID) | ORAL | Status: DC | PRN
  Administered 2019-06-08: 650 mg via ORAL
  Filled 2019-06-08: qty 2

## 2019-06-08 MED ORDER — ALBUTEROL SULFATE HFA 108 (90 BASE) MCG/ACT IN AERS: 2.0000 | INHALATION_SPRAY | Freq: Four times a day (QID) | RESPIRATORY_TRACT | Status: DC | PRN

## 2019-06-08 MED ORDER — INFLUENZA VAC SPLIT QUAD 0.5 ML IM SUSY
0.5000 mL | PREFILLED_SYRINGE | INTRAMUSCULAR | Status: AC
  Administered 2019-06-09: 0.5 mL via INTRAMUSCULAR
  Filled 2019-06-08: qty 0.5

## 2019-06-08 MED ORDER — RISAQUAD PO CAPS
1.0000 | ORAL_CAPSULE | Freq: Every day | ORAL | Status: DC
  Administered 2019-06-08: 1 via ORAL
  Filled 2019-06-08: qty 1

## 2019-06-08 MED ORDER — CIPROFLOXACIN IN D5W 400 MG/200ML IV SOLN
400.0000 mg | Freq: Two times a day (BID) | INTRAVENOUS | Status: DC
  Administered 2019-06-08 (×3): 400 mg via INTRAVENOUS
  Filled 2019-06-08 (×4): qty 200

## 2019-06-08 MED ORDER — KCL IN DEXTROSE-NACL 20-5-0.9 MEQ/L-%-% IV SOLN
INTRAVENOUS | Status: DC
  Administered 2019-06-08 (×3): via INTRAVENOUS
  Filled 2019-06-08 (×4): qty 1000

## 2019-06-08 MED ORDER — ZINC SULFATE 220 (50 ZN) MG PO CAPS
220.0000 mg | ORAL_CAPSULE | Freq: Every day | ORAL | Status: DC
  Administered 2019-06-08: 220 mg via ORAL
  Filled 2019-06-08 (×2): qty 1

## 2019-06-08 MED ORDER — PANTOPRAZOLE SODIUM 40 MG PO TBEC
40.0000 mg | DELAYED_RELEASE_TABLET | Freq: Every day | ORAL | Status: DC
  Administered 2019-06-08 – 2019-06-09 (×2): 40 mg via ORAL
  Filled 2019-06-08 (×2): qty 1

## 2019-06-08 MED ORDER — DIPHENHYDRAMINE HCL 25 MG PO CAPS
25.0000 mg | ORAL_CAPSULE | Freq: Four times a day (QID) | ORAL | Status: DC
  Administered 2019-06-08 – 2019-06-09 (×6): 25 mg via ORAL
  Filled 2019-06-08 (×8): qty 1

## 2019-06-08 MED ORDER — ENOXAPARIN SODIUM 40 MG/0.4ML ~~LOC~~ SOLN
40.0000 mg | Freq: Every day | SUBCUTANEOUS | Status: DC
  Administered 2019-06-08: 10:00:00 40 mg via SUBCUTANEOUS
  Filled 2019-06-08: qty 0.4

## 2019-06-08 NOTE — Plan of Care (Signed)

## 2019-06-08 NOTE — Progress Notes (Signed)
Pharmacy Antibiotic Note  Tracey Olson is a 48 y.o. female admitted on 06/07/2019 with intra-abdominal infectoin.  Pharmacy has been consulted for cipro dosing.  Plan: Cipro 400 mg IV q12h F/u scr/cultures  Height: 5\' 6"  (167.6 cm) Weight: 165 lb (74.8 kg) IBW/kg (Calculated) : 59.3  Temp (24hrs), Avg:99.3 F (37.4 C), Min:99.3 F (37.4 C), Max:99.3 F (37.4 C)  Recent Labs  Lab 06/07/19 1527 06/07/19 1830  WBC 13.6*  --   CREATININE 0.79  --   LATICACIDVEN  --  1.0    Estimated Creatinine Clearance: 88.9 mL/min (by C-G formula based on SCr of 0.79 mg/dL).    Allergies  Allergen Reactions  . Contrast Media [Iodinated Diagnostic Agents] Shortness Of Breath and Swelling    Angioedema, hives, pruritis, throat closing, wheezing and trouble breathing  . Penicillins Rash    Rash was inside her mouth with edema  . Penicillins Cross Reactors Other (See Comments)    Swelling around mouth, lips    Antimicrobials this admission: 11/28 cipro >>    >>   Dose adjustments this admission:   Microbiology results:  BCx:   UCx:    Sputum:    MRSA PCR:   Thank you for allowing pharmacy to be a part of this patient's care.  Dorrene German 06/08/2019 1:54 AM

## 2019-06-08 NOTE — H&P (Signed)
History and Physical   Tracey Olson HFW:[id number] DOB: 04-Feb-1971 DOA: 06/07/2019  Referring MD/NP/PA: Dr. Daun Peacock  PCP: Candice Camp, MD   Outpatient Specialists: None  Patient coming from: Home  Chief Complaint: Abdominal pain and rectal bleed  HPI: Tracey Olson is a 48 y.o. female with medical history significant of irritable bowel syndrome, GERD, hypertension, anxiety disorder and asthma who came to the ER complaining of rectal bleed and abdominal pain.  Pain was rated as 6 out of 10.  Symptoms started with cramping yesterday and then the rectal bleed came in today.  She had fever last week and was tested for Covid which was negative.  She initially thought it was her COVID-19 but this is getting worse.  She feels some tenderness most.  One episode of vomiting but now over.  Was slightly febrile patient has had at least 10 episodes of bleeding.  Every time she went to the bathroom she sees blood.  Her last colonoscopy was about 5 years ago with precancerous polyps.  Appears to also have diverticular disease.  GI was consulted and patient is being admitted for further work-up.  ED Course: Temperature 99.3 blood pressure 160/91 pulse 115 respiratory rate of 22 and oxygen sat 94% room air.  White count 13.6 hemoglobin 14.2 and platelets 244.  Sodium 130 potassium 3.3 chloride 105.  The rest of the chemistry appeared to be within normal.  Calcium 1.0.  Fecal occult blood is positive x2.  Urinalysis is negative.  CT angio abdomen and pelvis showed focal segmental thickening of the descending colon and proximal sigmoid colon with adjacent pericolonic inflammation this is most consistent with colitis.  Patient is being admitted for treatment.  Review of Systems: As per HPI otherwise 10 point review of systems negative.    Past Medical History:  Diagnosis Date   Anxiety    Asthma    allergy testing Dr Ephriam Knuckles?   Environmental allergies    GERD (gastroesophageal reflux disease)     Hypertension    IBS (irritable bowel syndrome)     Past Surgical History:  Procedure Laterality Date   BREAST EXCISIONAL BIOPSY Left    BREAST SURGERY Left 1990   lumpectomy   COLON SURGERY  08/2013   polyp removal   TUBAL LIGATION  2007     reports that she has never smoked. She has never used smokeless tobacco. She reports current alcohol use. She reports that she does not use drugs.  Allergies  Allergen Reactions   Contrast Media [Iodinated Diagnostic Agents] Shortness Of Breath and Swelling    Angioedema, hives, pruritis, throat closing, wheezing and trouble breathing   Penicillins Rash    Rash was inside her mouth with edema   Penicillins Cross Reactors Other (See Comments)    Swelling around mouth, lips    Family History  Problem Relation Age of Onset   Heart disease Maternal Grandfather    Heart disease Paternal Grandfather    Cancer Maternal Grandmother      Prior to Admission medications   Medication Sig Start Date End Date Taking? Authorizing Provider  albuterol (PROVENTIL HFA;VENTOLIN HFA) 108 (90 BASE) MCG/ACT inhaler Inhale 2 puffs into the lungs every 6 (six) hours as needed. Patient taking differently: Inhale 2 puffs into the lungs every 6 (six) hours as needed for wheezing or shortness of breath.  09/26/13  Yes Collene Gobble, MD  chlorpheniramine (CHLOR-TRIMETON) 4 MG tablet Take 4 mg by mouth 2 (two) times daily as needed for allergies.  Yes [provider]  omeprazole (PRILOSEC) 20 MG capsule Take 20 mg by mouth daily.   Yes [provider]  Probiotic Product (PROBIOTIC-10 PO) Take 1 tablet by mouth at bedtime.   Yes [provider]  Zinc 100 MG TABS Take 100 mg by mouth at bedtime.   Yes [provider]    Physical Exam: Vitals:   06/07/19 2353 06/07/19 2356 06/08/19 0000 06/08/19 0030  BP: (!) 153/70 (!) 158/75 (!) 153/65 123/62  Pulse:  (!) 112 (!) 108 (!) 102  Resp:   (!) 22 20  Temp:      TempSrc:       SpO2:  100% 100% 97%  Weight:      Height:          Constitutional: Acutely ill looking mild distress Vitals:   06/07/19 2353 06/07/19 2356 06/08/19 0000 06/08/19 0030  BP: (!) 153/70 (!) 158/75 (!) 153/65 123/62  Pulse:  (!) 112 (!) 108 (!) 102  Resp:   (!) 22 20  Temp:      TempSrc:      SpO2:  100% 100% 97%  Weight:      Height:       Eyes: PERRL, lids and conjunctivae normal ENMT: Mucous membranes are moist. Posterior pharynx clear of any exudate or lesions.Normal dentition.  Neck: normal, supple, no masses, no thyromegaly Respiratory: clear to auscultation bilaterally, no wheezing, no crackles. Normal respiratory effort. No accessory muscle use.  Cardiovascular: Regular rate and rhythm, no murmurs / rubs / gallops. No extremity edema. 2+ pedal pulses. No carotid bruits.  Abdomen: Left lower quadrant abdominal tenderness, no guarding, no masses palpated. No hepatosplenomegaly. Bowel sounds positive.  Musculoskeletal: no clubbing / cyanosis. No joint deformity upper and lower extremities. Good ROM, no contractures. Normal muscle tone.  Skin: no rashes, lesions, ulcers. No induration Neurologic: CN 2-12 grossly intact. Sensation intact, DTR normal. Strength 5/5 in all 4.  Psychiatric: Normal judgment and insight. Alert and oriented x 3. Normal mood.     Labs on Admission: I have personally reviewed following labs and imaging studies  CBC: Recent Labs  Lab 06/07/19 1527  WBC 13.6*  HGB 14.2  HCT 42.5  MCV 94.2  PLT 244   Basic Metabolic Panel: Recent Labs  Lab 06/07/19 1527  NA 138  K 3.3*  CL 105  CO2 24  GLUCOSE 131*  BUN 12  CREATININE 0.79  CALCIUM 9.1   GFR: Estimated Creatinine Clearance: 88.9 mL/min (by C-G formula based on SCr of 0.79 mg/dL). Liver Function Tests: Recent Labs  Lab 06/07/19 1527  AST 15  ALT 21  ALKPHOS 33*  BILITOT 0.6  PROT 7.4  ALBUMIN 4.3   Recent Labs  Lab 06/07/19 1527  LIPASE 30   No results for input(s):  AMMONIA in the last 168 hours. Coagulation Profile: No results for input(s): INR, PROTIME in the last 168 hours. Cardiac Enzymes: No results for input(s): CKTOTAL, CKMB, CKMBINDEX, TROPONINI in the last 168 hours. BNP (last 3 results) No results for input(s): PROBNP in the last 8760 hours. HbA1C: No results for input(s): HGBA1C in the last 72 hours. CBG: No results for input(s): GLUCAP in the last 168 hours. Lipid Profile: No results for input(s): CHOL, HDL, LDLCALC, TRIG, CHOLHDL, LDLDIRECT in the last 72 hours. Thyroid Function Tests: No results for input(s): TSH, T4TOTAL, FREET4, T3FREE, THYROIDAB in the last 72 hours. Anemia Panel: No results for input(s): VITAMINB12, FOLATE, FERRITIN, TIBC, IRON, RETICCTPCT in the  last 72 hours. Urine analysis:    Component Value Date/Time   COLORURINE YELLOW 06/07/2019 1527   APPEARANCEUR CLEAR 06/07/2019 1527   LABSPEC 1.023 06/07/2019 1527   PHURINE 5.0 06/07/2019 1527   GLUCOSEU NEGATIVE 06/07/2019 1527   HGBUR NEGATIVE 06/07/2019 1527   BILIRUBINUR NEGATIVE 06/07/2019 1527   KETONESUR NEGATIVE 06/07/2019 1527   PROTEINUR NEGATIVE 06/07/2019 1527   UROBILINOGEN 0.2 04/07/2011 1141   NITRITE NEGATIVE 06/07/2019 1527   LEUKOCYTESUR NEGATIVE 06/07/2019 1527   Sepsis Labs: (procalcitonin:4,lacticidven:4) ) Recent Results (from the past 240 hour(s))  Novel Coronavirus, NAA ([facility])     Status: None   Collection Time: 06/05/19 12:00 AM   Specimen: Nasopharyngeal(NP) swabs in vial transport medium   NASOPHARYNGE  TESTING  Result Value Ref Range Status   SARS-CoV-2, NAA Not Detected Not Detected Final    Comment: This nucleic acid amplification test was developed and its performance characteristics determined by [employer]. Nucleic acid amplification tests include PCR and TMA. This test has not been FDA cleared or approved. This test has been authorized by FDA under an Emergency Use Authorization (EUA). This test  is only authorized for the duration of time the declaration that circumstances exist justifying the authorization of the emergency use of in vitro diagnostic tests for detection of SARS-CoV-2 virus and/or diagnosis of COVID-19 infection under section 564(b)(1) of the Act, 21 U.S.C. 161WRU-0(A) (1), unless the authorization is terminated or revoked sooner. When diagnostic testing is negative, the possibility of a false negative result should be considered in the context of a patient's recent exposures and the presence of clinical signs and symptoms consistent with COVID-19. An individual without symptoms of COVID-19 and who is not shedding SARS-CoV-2 virus would  expect to have a negative (not detected) result in this assay.      Radiological Exams on Admission: Ct Angio Abd/pel W And/or Wo Contrast  Result Date: 06/08/2019 CLINICAL DATA:  Ischemia, abdomen and pelvis EXAM: CTA ABDOMEN AND PELVIS WITHOUT AND WITH CONTRAST TECHNIQUE: Multidetector CT imaging of the abdomen and pelvis was performed using the standard protocol during bolus administration of intravenous contrast. Multiplanar reconstructed images and MIPs were obtained and reviewed to evaluate the vascular anatomy. CONTRAST:  OMNIPAQUE IOHEXOL 350 MG/ML SOLN COMPARISON:  CT 03/28/2012 FINDINGS: VASCULAR Aorta: Minimal atherosclerotic plaque within the normal caliber aorta. No significant stenosis evidence of aneurysm, dissection or vasculitis. Celiac: Patent without evidence of aneurysm, dissection, vasculitis or significant stenosis. SMA: Patent without evidence of aneurysm, dissection, vasculitis or significant stenosis. Renals: Both renal arteries are patent without evidence of aneurysm, dissection, vasculitis, fibromuscular dysplasia or significant stenosis. IMA: Patent without evidence of aneurysm, dissection, vasculitis or significant stenosis. Inflow: Patent without evidence of aneurysm, dissection, vasculitis or  significant stenosis. Proximal Outflow: Bilateral common femoral and visualized portions of the superficial and profunda femoral arteries are patent without evidence of aneurysm, dissection, vasculitis or significant stenosis. Veins: No gross abnormality of the major venous structures on portal venous phase imaging Review of the MIP images confirms the above findings. NON-VASCULAR Lower chest: Lung bases are clear. Normal heart size. No pericardial effusion. Hepatobiliary: Diffuse hepatic hypoattenuation compatible with hepatic steatosis. No focal liver abnormality is seen. No gallstones, gallbladder wall thickening, or biliary dilatation. Pancreas: Unremarkable. No pancreatic ductal dilatation or surrounding inflammatory changes. Spleen: Normal in size without focal abnormality. Adrenals/Urinary Tract: Normal adrenal glands. No concerning renal masses or urolithiasis. Bilateral extrarenal pelves. Mild likely physiologic ureterectasis with urinary bladder distention. Stomach/Bowel: Distal esophagus, stomach  and duodenal sweep are unremarkable. No small bowel wall thickening or dilatation. No evidence of obstruction. A normal appendix is visualized. Focal segmental thickening of the descending colon and proximal sigmoid with adjacent pericolonic inflammation. Normal opacification of the adjacent vessels on both arterial and venous phase imaging. No extraluminal gas or fluid. No air within the draining veins. No pneumatosis. Lymphatic: No suspicious or enlarged lymph nodes in the included lymphatic chains. Reproductive: Heterogeneous fibroid uterus. Metallic ligation clips in the pelvis. Other: No abdominopelvic free fluid or free gas. No bowel containing hernias. Musculoskeletal: No acute osseous abnormality or suspicious osseous lesion. IMPRESSION: VASCULAR 1. Minimal atherosclerotic plaque within the normal caliber aorta. 2. No significant stenoses or proximal arterial occlusions identified. NON-VASCULAR 1. Focal  segmental thickening of the descending colon and proximal sigmoid colon with adjacent pericolonic inflammation, most consistent with colitis of infectious or inflammatory etiology. 2. Hepatic steatosis. 3. Fibroid uterus. 4. Mild likely physiologic ureterectasis with urinary bladder distention. Electronically Signed   By: Lovena Le M.D.   On: 06/08/2019 00:12      Assessment/Plan Principal Problem:   Colitis, acute Active Problems:   Hypokalemia   HTN (hypertension)   GERD (gastroesophageal reflux disease)   IBS (irritable bowel syndrome)   Allergy to IVP dye, initial encounter     #1 acute colitis: Patient will be admitted.  Initiate Cipro and Flagyl IV.  Pain management.  GI consult if bleeding continues.  She might require repeat colonoscopy soon.  #2 hypokalemia: Most likely secondary to GI loss.  Replete potassium.  #3 GERD: Continue with PPIs.  #4 hypertension: Continue his home regimen.  #5 IBS: We will continue with her home regimen and monitor  #6 allergy to IVP dye: Patient apparently started having some lip swelling and general allergy after receiving IV dye for her CT.  At this point she has received initial treatment with Benadryl and steroids.  Symptoms appear to be receding.  We will observe patient very closely and if further signs of worsening allergy reaction we will treat.     DVT prophylaxis: Lovenox Code Status: Full code Family Communication: No family at Disposition Plan: Home Consults called: None Admission status: Inpatient  Severity of Illness: The appropriate patient status for this patient is INPATIENT. Inpatient status is judged to be reasonable and necessary in order to provide the required intensity of service to ensure the patient's safety. The patient's presenting symptoms, physical exam findings, and initial radiographic and laboratory data in the context of their chronic comorbidities is felt to place them at high risk for further clinical  deterioration. Furthermore, it is not anticipated that the patient will be medically stable for discharge from the hospital within 2 midnights of admission. The following factors support the patient status of inpatient.   " The patient's presenting symptoms include abdominal pain and rectal bleed. " The worrisome physical exam findings include left lower upper quadrant abdominal tenderness. " The initial radiographic and laboratory data are worrisome because of acute colitis. " The chronic co-morbidities include hypertension and IBS.   * I certify that at the point of admission it is my clinical judgment that the patient will require inpatient hospital care spanning beyond 2 midnights from the point of admission due to high intensity of service, high risk for further deterioration and high frequency of surveillance required.Barbette Merino MD [facility] Pager [phone]  If 7PM-7AM, please contact night-coverage [email or web address] Password [email or web address]  06/08/2019, 1:36 AM

## 2019-06-08 NOTE — Progress Notes (Signed)
48 year old lady admitted early morning with acute colitis.  Seen and examined.  She has not had any bowel movement or any rectal bleeding since she presented herself to the emergency department.  Her abdominal pain is improved.  On examination of the abdomen, she has mild tenderness at epigastric and left lower quadrant and left upper quadrant region.  No nausea.  No indication of GI consultation at this point in time.  We will continue ciprofloxacin and Flagyl and start on clear liquids and advance diet as tolerated.

## 2019-06-08 NOTE — Plan of Care (Signed)
Plan of care initiated and discussed with the patient. 

## 2019-06-08 NOTE — Progress Notes (Signed)
PHARMACY NOTE - Cipro  Pharmacy has been assisting with dosing of Cipro for intra-abdominal infection. Dosage remains stable at Cipro 400mg  IV q12h and need for further dosage adjustment appears unlikely at present.    Renal function stable.   Recommend change to oral antibiotics once patient able to tolerate diet.   Will sign off at this time.  Please reconsult if a change in clinical status warrants re-evaluation of dosage.  Netta Cedars, PharmD, BCPS 06/08/2019@11 :20 AM

## 2019-06-08 NOTE — ED Notes (Addendum)
Pt returned from [place], CT made this nurse aware that pt had become short of breath, ithcy throat and swollen lips and eyes. EDP made aware and put in for an epi-pen. Pt given Epi-Pen and monitored and pts symptoms resolved. VS WNL and shortness of breath gone. Will continue to monitor for rebound symptoms.

## 2019-06-09 LAB — BASIC METABOLIC PANEL
Anion gap: 5 (ref 5–15)
BUN: 6 mg/dL (ref 6–20)
CO2: 25 mmol/L (ref 22–32)
Calcium: 8.6 mg/dL — ABNORMAL LOW (ref 8.9–10.3)
Chloride: 109 mmol/L (ref 98–111)
Creatinine, Ser: 0.81 mg/dL (ref 0.44–1.00)
GFR calc Af Amer: >60 mL/min (ref >60)
GFR calc non Af Amer: >60 mL/min (ref >60)
Glucose, Bld: 103 mg/dL — ABNORMAL HIGH (ref 70–99)
Potassium: 4 mmol/L (ref 3.5–5.1)
Sodium: 139 mmol/L (ref 135–145)

## 2019-06-09 LAB — MAGNESIUM: Magnesium: 1.8 mg/dL (ref 1.7–2.4)

## 2019-06-09 MED ORDER — METRONIDAZOLE 500 MG PO TABS: 500.0000 mg | ORAL_TABLET | Freq: Three times a day (TID) | ORAL | 0 refills | Status: AC

## 2019-06-09 MED ORDER — CIPROFLOXACIN HCL 500 MG PO TABS
500.0000 mg | ORAL_TABLET | Freq: Two times a day (BID) | ORAL | Status: DC
  Administered 2019-06-09: 500 mg via ORAL
  Filled 2019-06-09: qty 1

## 2019-06-09 MED ORDER — CIPROFLOXACIN HCL 500 MG PO TABS: 500.0000 mg | ORAL_TABLET | Freq: Two times a day (BID) | ORAL | 0 refills | Status: AC

## 2019-06-09 NOTE — Plan of Care (Signed)
  Problem: Education: Goal: Knowledge of General Education information will improve Description: Including pain rating scale, medication(s)/side effects and non-pharmacologic comfort measures Outcome: Adequate for Discharge   Problem: Health Behavior/Discharge Planning: Goal: Ability to manage health-related needs will improve Outcome: Adequate for Discharge   Problem: Clinical Measurements: Goal: Ability to maintain clinical measurements within normal limits will improve Outcome: Adequate for Discharge Goal: Will remain free from infection Outcome: Adequate for Discharge Goal: Diagnostic test results will improve Outcome: Adequate for Discharge Goal: Cardiovascular complication will be avoided Outcome: Adequate for Discharge   Problem: Activity: Goal: Risk for activity intolerance will decrease Outcome: Adequate for Discharge   Problem: Nutrition: Goal: Adequate nutrition will be maintained Outcome: Adequate for Discharge   Problem: Elimination: Goal: Will not experience complications related to bowel motility Outcome: Adequate for Discharge Goal: Will not experience complications related to urinary retention Outcome: Adequate for Discharge   Problem: Pain Managment: Goal: General experience of comfort will improve Outcome: Adequate for Discharge   Problem: Safety: Goal: Ability to remain free from injury will improve Outcome: Adequate for Discharge   Problem: Skin Integrity: Goal: Risk for impaired skin integrity will decrease Outcome: Adequate for Discharge   

## 2019-06-09 NOTE — Discharge Summary (Signed)
Physician Discharge Summary  Zuri Lascala BJY:[id number] DOB: 10-16-1970 DOA: 06/07/2019  PCP: Candice Camp, MD  Admit date: 06/07/2019 Discharge date: 06/09/2019  Admitted From: Home Disposition: Home  Recommendations for Outpatient Follow-up:  1. Follow up with PCP in 1-2 weeks 2. Please obtain BMP/CBC in one week 3. Please follow up on the following pending results:  Home Health: None Equipment/Devices: None  Discharge Condition: Stable CODE STATUS: Full code Diet recommendation: Soft, advance to regular when comfortable  Subjective: Seen and examined.  Abdominal pain has improved significantly.  Only 2 out of 10.  Tolerating soft diet.  HPI: Tracey Olson is a 48 y.o. female with medical history significant of irritable bowel syndrome, GERD, hypertension, anxiety disorder and asthma who came to the ER complaining of rectal bleed and abdominal pain.  Pain was rated as 6 out of 10.  Symptoms started with cramping yesterday and then the rectal bleed came in today.  She had fever last week and was tested for Covid which was negative.  She initially thought it was her COVID-19 but this is getting worse.  She feels some tenderness most.  One episode of vomiting but now over.  Was slightly febrile patient has had at least 10 episodes of bleeding.  Every time she went to the bathroom she sees blood.  Her last colonoscopy was about 5 years ago with precancerous polyps.  Appears to also have diverticular disease.  GI was consulted and patient is being admitted for further work-up.  ED Course: Temperature 99.3 blood pressure 160/91 pulse 115 respiratory rate of 22 and oxygen sat 94% room air.  White count 13.6 hemoglobin 14.2 and platelets 244.  Sodium 130 potassium 3.3 chloride 105.  The rest of the chemistry appeared to be within normal.  Calcium 1.0.  Fecal occult blood is positive x2.  Urinalysis is negative.  CT angio abdomen and pelvis showed focal segmental thickening of the descending colon and  proximal sigmoid colon with adjacent pericolonic inflammation this is most consistent with colitis.  Patient is being admitted for treatment.  Brief/Interim Summary: Patient was admitted in the hospital for acute colitis.  She was started on IV ciprofloxacin and Flagyl.  She started feeling better.  She feels much better with minimal abdominal pain but no abdominal tenderness on exam today.  She is tolerating soft diet.  Has not developed any fever.  She has not had any blood per rectum since admission.  She is being discharged in stable condition for 8 more days of oral ciprofloxacin and Flagyl.  Discharge Diagnoses:  Principal Problem:   Colitis, acute Active Problems:   Hypokalemia   HTN (hypertension)   GERD (gastroesophageal reflux disease)   IBS (irritable bowel syndrome)   Allergy to IVP dye, initial encounter    Discharge Instructions  Discharge Instructions    Discharge patient   Complete by: As directed    Discharge disposition: 01-Home or Self Care   Discharge patient date: 06/09/2019     Allergies as of 06/09/2019      Reactions   Contrast Media [iodinated Diagnostic Agents] Shortness Of Breath, Swelling   Angioedema, hives, pruritis, throat closing, wheezing and trouble breathing   Penicillins Rash   Rash was inside her mouth with edema   Penicillins Cross Reactors Other (See Comments)   Swelling around mouth, lips      Medication List    TAKE these medications   albuterol 108 (90 Base) MCG/ACT inhaler Commonly known as: VENTOLIN HFA Inhale 2 puffs  into the lungs every 6 (six) hours as needed. What changed: reasons to take this   chlorpheniramine 4 MG tablet Commonly known as: CHLOR-TRIMETON Take 4 mg by mouth 2 (two) times daily as needed for allergies.   ciprofloxacin 500 MG tablet Commonly known as: Cipro Take 1 tablet (500 mg total) by mouth 2 (two) times daily for 8 days.   metroNIDAZOLE 500 MG tablet Commonly known as: Flagyl Take 1 tablet (500  mg total) by mouth 3 (three) times daily for 8 days.   omeprazole 20 MG capsule Commonly known as: PRILOSEC Take 20 mg by mouth daily.   PROBIOTIC-10 PO Take 1 tablet by mouth at bedtime.   Zinc 100 MG Tabs Take 100 mg by mouth at bedtime.      Follow-up Information    Candice Camp, MD Follow up in 1 week(s).   Specialty: Obstetrics and Gynecology Contact information: [address] [place], [address] [place] [address] [phone]          Allergies  Allergen Reactions  . Contrast Media [Iodinated Diagnostic Agents] Shortness Of Breath and Swelling    Angioedema, hives, pruritis, throat closing, wheezing and trouble breathing  . Penicillins Rash    Rash was inside her mouth with edema  . Penicillins Cross Reactors Other (See Comments)    Swelling around mouth, lips    Consultations: None   Procedures/Studies: Ct Angio Abd/pel W And/or Wo Contrast  Result Date: 06/08/2019 CLINICAL DATA:  Ischemia, abdomen and pelvis EXAM: CTA ABDOMEN AND PELVIS WITHOUT AND WITH CONTRAST TECHNIQUE: Multidetector CT imaging of the abdomen and pelvis was performed using the standard protocol during bolus administration of intravenous contrast. Multiplanar reconstructed images and MIPs were obtained and reviewed to evaluate the vascular anatomy. CONTRAST:  OMNIPAQUE IOHEXOL 350 MG/ML SOLN COMPARISON:  CT 03/28/2012 FINDINGS: VASCULAR Aorta: Minimal atherosclerotic plaque within the normal caliber aorta. No significant stenosis evidence of aneurysm, dissection or vasculitis. Celiac: Patent without evidence of aneurysm, dissection, vasculitis or significant stenosis. SMA: Patent without evidence of aneurysm, dissection, vasculitis or significant stenosis. Renals: Both renal arteries are patent without evidence of aneurysm, dissection, vasculitis, fibromuscular dysplasia or significant stenosis. IMA: Patent without evidence of aneurysm, dissection, vasculitis or significant stenosis. Inflow:  Patent without evidence of aneurysm, dissection, vasculitis or significant stenosis. Proximal Outflow: Bilateral common femoral and visualized portions of the superficial and profunda femoral arteries are patent without evidence of aneurysm, dissection, vasculitis or significant stenosis. Veins: No gross abnormality of the major venous structures on portal venous phase imaging Review of the MIP images confirms the above findings. NON-VASCULAR Lower chest: Lung bases are clear. Normal heart size. No pericardial effusion. Hepatobiliary: Diffuse hepatic hypoattenuation compatible with hepatic steatosis. No focal liver abnormality is seen. No gallstones, gallbladder wall thickening, or biliary dilatation. Pancreas: Unremarkable. No pancreatic ductal dilatation or surrounding inflammatory changes. Spleen: Normal in size without focal abnormality. Adrenals/Urinary Tract: Normal adrenal glands. No concerning renal masses or urolithiasis. Bilateral extrarenal pelves. Mild likely physiologic ureterectasis with urinary bladder distention. Stomach/Bowel: Distal esophagus, stomach and duodenal sweep are unremarkable. No small bowel wall thickening or dilatation. No evidence of obstruction. A normal appendix is visualized. Focal segmental thickening of the descending colon and proximal sigmoid with adjacent pericolonic inflammation. Normal opacification of the adjacent vessels on both arterial and venous phase imaging. No extraluminal gas or fluid. No air within the draining veins. No pneumatosis. Lymphatic: No suspicious or enlarged lymph nodes in the included lymphatic chains. Reproductive: Heterogeneous fibroid uterus. Metallic  ligation clips in the pelvis. Other: No abdominopelvic free fluid or free gas. No bowel containing hernias. Musculoskeletal: No acute osseous abnormality or suspicious osseous lesion. IMPRESSION: VASCULAR 1. Minimal atherosclerotic plaque within the normal caliber aorta. 2. No significant stenoses or  proximal arterial occlusions identified. NON-VASCULAR 1. Focal segmental thickening of the descending colon and proximal sigmoid colon with adjacent pericolonic inflammation, most consistent with colitis of infectious or inflammatory etiology. 2. Hepatic steatosis. 3. Fibroid uterus. 4. Mild likely physiologic ureterectasis with urinary bladder distention. Electronically Signed   By: Kreg ShropshirePrice  DeHay M.D.   On: 06/08/2019 00:12      Discharge Exam: Vitals:   06/08/19 2208 06/09/19 0542  BP: 137/82 114/65  Pulse: 70 62  Resp: 18 16  Temp: 98.7 F (37.1 C) 98.5 F (36.9 C)  SpO2: 100% 100%   Vitals:   06/08/19 0520 06/08/19 1402 06/08/19 2208 06/09/19 0542  BP: (!) 105/53 135/82 137/82 114/65  Pulse: 74 82 70 62  Resp: 15 16 18 16   Temp: 98.6 F (37 C) 98.8 F (37.1 C) 98.7 F (37.1 C) 98.5 F (36.9 C)  TempSrc: Oral Oral Oral Oral  SpO2: 98% 100% 100% 100%  Weight:      Height:        General: Pt is alert, awake, not in acute distress Cardiovascular: RRR, S1/S2 +, no rubs, no gallops Respiratory: CTA bilaterally, no wheezing, no rhonchi Abdominal: Soft, NT, ND, bowel sounds + Extremities: no edema, no cyanosis    The results of significant diagnostics from this hospitalization (including imaging, microbiology, ancillary and laboratory) are listed below for reference.     Microbiology: Recent Results (from the past 240 hour(s))  Novel Coronavirus, NAA ([facility])     Status: None   Collection Time: 06/05/19 12:00 AM   Specimen: Nasopharyngeal(NP) swabs in vial transport medium   NASOPHARYNGE  TESTING  Result Value Ref Range Status   SARS-CoV-2, NAA Not Detected Not Detected Final    Comment: This nucleic acid amplification test was developed and its performance characteristics determined by [facility]. Nucleic acid amplification tests include PCR and TMA. This test has not been FDA cleared or approved. This test has been authorized by FDA under an Emergency  Use Authorization (EUA). This test is only authorized for the duration of time the declaration that circumstances exist justifying the authorization of the emergency use of in vitro diagnostic tests for detection of SARS-CoV-2 virus and/or diagnosis of COVID-19 infection under section 564(b)(1) of the Act, 21 U.S.C. 161WRU-0(A360bbb-3(b) (1), unless the authorization is terminated or revoked sooner. When diagnostic testing is negative, the possibility of a false negative result should be considered in the context of a patient's recent exposures and the presence of clinical signs and symptoms consistent with COVID-19. An individual without symptoms of COVID-19 and who is not shedding SARS-CoV-2 virus would  expect to have a negative (not detected) result in this assay.   SARS CORONAVIRUS 2 (TAT 6-24 HRS) Nasopharyngeal Nasopharyngeal Swab     Status: None   Collection Time: 06/08/19  2:18 AM   Specimen: Nasopharyngeal Swab  Result Value Ref Range Status   SARS Coronavirus 2 NEGATIVE NEGATIVE Final    Comment: (NOTE) SARS-CoV-2 target nucleic acids are NOT DETECTED. The SARS-CoV-2 RNA is generally detectable in upper and lower respiratory specimens during the acute phase of infection. Negative results do not preclude SARS-CoV-2 infection, do not rule out co-infections with other pathogens, and should not be used as the sole basis for  treatment or other patient management decisions. Negative results must be combined with clinical observations, patient history, and epidemiological information. The expected result is Negative. Fact Sheet for Patients: HairSlick.no Fact Sheet for Healthcare Providers: quierodirigir.com This test is not yet approved or cleared by the [place] FDA and  has been authorized for detection and/or diagnosis of SARS-CoV-2 by FDA under an Emergency Use Authorization (EUA). This EUA will remain  in effect (meaning  this test can be used) for the duration of the COVID-19 declaration under Section 56 4(b)(1) of the Act, 21 U.S.C. section 360bbb-3(b)(1), unless the authorization is terminated or revoked sooner. Performed at [facility], [address], [place], [place] [address]      Labs: BNP (last 3 results) No results for input(s): BNP in the last 8760 hours. Basic Metabolic Panel: Recent Labs  Lab 06/07/19 1527 06/08/19 0327 06/09/19 0818  NA 138 136 139  K 3.3* 3.6 4.0  CL 105 105 109  CO2 24 21* 25  GLUCOSE 131* 183* 103*  BUN CREATININE 0.79 0.73 0.81  CALCIUM 9.1 8.4* 8.6*  MG  --   --  1.8   Liver Function Tests: Recent Labs  Lab 06/07/19 1527 06/08/19 0327  AST 15 15  ALT 21 17  ALKPHOS 33* 25*  BILITOT 0.6 0.6  PROT 7.4 6.4*  ALBUMIN 4.3 3.6   Recent Labs  Lab 06/07/19 1527  LIPASE 30   No results for input(s): AMMONIA in the last 168 hours. CBC: Recent Labs  Lab 06/07/19 1527 06/08/19 0327  WBC 13.6* 11.9*  HGB 14.2 12.0  HCT 42.5 36.8  MCV 94.2 94.8  PLT 244 209   Cardiac Enzymes: No results for input(s): CKTOTAL, CKMB, CKMBINDEX, TROPONINI in the last 168 hours. BNP: Invalid input(s): POCBNP CBG: No results for input(s): GLUCAP in the last 168 hours. D-Dimer No results for input(s): DDIMER in the last 72 hours. Hgb A1c No results for input(s): HGBA1C in the last 72 hours. Lipid Profile No results for input(s): CHOL, HDL, LDLCALC, TRIG, CHOLHDL, LDLDIRECT in the last 72 hours. Thyroid function studies No results for input(s): TSH, T4TOTAL, T3FREE, THYROIDAB in the last 72 hours.  Invalid input(s): FREET3 Anemia work up No results for input(s): VITAMINB12, FOLATE, FERRITIN, TIBC, IRON, RETICCTPCT in the last 72 hours. Urinalysis    Component Value Date/Time   COLORURINE YELLOW 06/07/2019 1527   APPEARANCEUR CLEAR 06/07/2019 1527   LABSPEC 1.023 06/07/2019 1527   PHURINE 5.0 06/07/2019 1527   GLUCOSEU NEGATIVE 06/07/2019  1527   HGBUR NEGATIVE 06/07/2019 1527   BILIRUBINUR NEGATIVE 06/07/2019 1527   KETONESUR NEGATIVE 06/07/2019 1527   PROTEINUR NEGATIVE 06/07/2019 1527   UROBILINOGEN 0.2 04/07/2011 1141   NITRITE NEGATIVE 06/07/2019 1527   LEUKOCYTESUR NEGATIVE 06/07/2019 1527   Sepsis Labs Invalid input(s): PROCALCITONIN,  WBC,  LACTICIDVEN Microbiology Recent Results (from the past 240 hour(s))  Novel Coronavirus, NAA ([facility])     Status: None   Collection Time: 06/05/19 12:00 AM   Specimen: Nasopharyngeal(NP) swabs in vial transport medium   NASOPHARYNGE  TESTING  Result Value Ref Range Status   SARS-CoV-2, NAA Not Detected Not Detected Final    Comment: This nucleic acid amplification test was developed and its performance characteristics determined by [facility]. Nucleic acid amplification tests include PCR and TMA. This test has not been FDA cleared or approved. This test has been authorized by FDA under an Emergency Use Authorization (EUA). This test is only  authorized for the duration of time the declaration that circumstances exist justifying the authorization of the emergency use of in vitro diagnostic tests for detection of SARS-CoV-2 virus and/or diagnosis of COVID-19 infection under section 564(b)(1) of the Act, 21 U.S.C. 269SWN-4(O) (1), unless the authorization is terminated or revoked sooner. When diagnostic testing is negative, the possibility of a false negative result should be considered in the context of a patient's recent exposures and the presence of clinical signs and symptoms consistent with COVID-19. An individual without symptoms of COVID-19 and who is not shedding SARS-CoV-2 virus would  expect to have a negative (not detected) result in this assay.   SARS CORONAVIRUS 2 (TAT 6-24 HRS) Nasopharyngeal Nasopharyngeal Swab     Status: None   Collection Time: 06/08/19  2:18 AM   Specimen: Nasopharyngeal Swab  Result Value Ref Range Status   SARS Coronavirus  2 NEGATIVE NEGATIVE Final    Comment: (NOTE) SARS-CoV-2 target nucleic acids are NOT DETECTED. The SARS-CoV-2 RNA is generally detectable in upper and lower respiratory specimens during the acute phase of infection. Negative results do not preclude SARS-CoV-2 infection, do not rule out co-infections with other pathogens, and should not be used as the sole basis for treatment or other patient management decisions. Negative results must be combined with clinical observations, patient history, and epidemiological information. The expected result is Negative. Fact Sheet for Patients: SugarRoll.be Fact Sheet for Healthcare Providers: https://www.woods-mathews.com/ This test is not yet approved or cleared by the [place] FDA and  has been authorized for detection and/or diagnosis of SARS-CoV-2 by FDA under an Emergency Use Authorization (EUA). This EUA will remain  in effect (meaning this test can be used) for the duration of the COVID-19 declaration under Section 56 4(b)(1) of the Act, 21 U.S.C. section 360bbb-3(b)(1), unless the authorization is terminated or revoked sooner. Performed at [facility], [place] [address] [place], [place] [address]      Time coordinating discharge: Over 30 minutes  SIGNED:   Darliss Cheney, MD  [facility] 06/09/2019, 1:28 PM  If 7PM-7AM, please contact night-coverage [email or web address] Password [email or web address]

## 2019-06-09 NOTE — Discharge Instructions (Signed)
Colitis  Colitis is inflammation of the colon. Colitis may last a short time (be acute), or it may last a long time (become chronic). What are the causes? This condition may be caused by:  Viruses.  Bacteria.  Reaction to medicine.  Certain autoimmune diseases such as Crohn's disease or ulcerative colitis.  Radiation treatment.  Decreased blood flow to the bowel (ischemia). What are the signs or symptoms? Symptoms of this condition include:  Watery diarrhea.  Passing bloody or tarry stool.  Pain.  Fever.  Vomiting.  Tiredness (fatigue).  Weight loss.  Bloating.  Abdominal pain.  Having fewer bowel movements than usual.  A strong and sudden urge to have a bowel movement.  Feeling like the bowel is not empty after a bowel movement. How is this diagnosed? This condition is diagnosed with a stool test or a blood test. You may also have other tests, such as:  X-rays.  CT scan.  Colonoscopy.  Endoscopy.  Biopsy. How is this treated? Treatment for this condition depends on the cause. The condition may be treated by:  Resting the bowel. This involves not eating or drinking for a period of time.  Fluids that are given through an IV.  Medicine for pain and diarrhea.  Antibiotic medicines.  Cortisone medicines.  Surgery. Follow these instructions at home: Eating and drinking   Follow instructions from your health care provider about eating or drinking restrictions.  Drink enough fluid to keep your urine pale yellow.  Work with a dietitian to determine which foods cause your condition to flare up.  Avoid foods that cause flare-ups.  Eat a well-balanced diet. General instructions  If you were prescribed an antibiotic medicine, take it as told by your health care provider. Do not stop taking the antibiotic even if you start to feel better.  Take over-the-counter and prescription medicines only as told by your health care provider.  Keep all  follow-up visits as told by your health care provider. This is important. Contact a health care provider if:  Your symptoms do not go away.  You develop new symptoms. Get help right away if you:  Have a fever that does not go away with treatment.  Develop chills.  Have extreme weakness, fainting, or dehydration.  Have repeated vomiting.  Develop severe pain in your abdomen.  Pass bloody or tarry stool. Summary  Colitis is inflammation of the colon. Colitis may last a short time (be acute), or it may last a long time (become chronic).  Treatment for this condition depends on the cause and may include resting the bowel, taking medicines, or having surgery.  If you were prescribed an antibiotic medicine, take it as told by your health care provider. Do not stop taking the antibiotic even if you start to feel better.  Get help right away if you develop severe pain in your abdomen.  Keep all follow-up visits as told by your health care provider. This is important. This information is not intended to replace advice given to you by your health care provider. Make sure you discuss any questions you have with your health care provider. Document Released: 08/04/2004 Document Revised: 12/28/2017 Document Reviewed: 12/28/2017 Elsevier Patient Education  2020 [employer]
